# Patient Record
Sex: Male | Born: 1990 | Race: Black or African American | Hispanic: No | Marital: Single | Smoking: Current every day smoker
Health system: Southern US, Community
[De-identification: ages and names within clinical notes are randomized; demographics above are authoritative.]

## PROBLEM LIST (undated history)

## (undated) DIAGNOSIS — E119 Type 2 diabetes mellitus without complications: Secondary | ICD-10-CM

---
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
## 2011-04-21 ENCOUNTER — Emergency Department: Payer: Self-pay | Admitting: Internal Medicine

## 2014-02-27 ENCOUNTER — Emergency Department: Payer: Self-pay | Admitting: Emergency Medicine

## 2014-02-27 LAB — CBC WITH DIFFERENTIAL/PLATELET
BASOS ABS: 0.1 10*3/uL (ref 0.0–0.1)
Basophil %: 1.2 %
Eosinophil #: 0.6 10*3/uL (ref 0.0–0.7)
Eosinophil %: 6.1 %
HCT: 40.7 % (ref 40.0–52.0)
HGB: 14.3 g/dL (ref 13.0–18.0)
Lymphocyte #: 2.3 10*3/uL (ref 1.0–3.6)
Lymphocyte %: 23.1 %
MCH: 27.2 pg (ref 26.0–34.0)
MCHC: 35.2 g/dL (ref 32.0–36.0)
MCV: 77 fL — ABNORMAL LOW (ref 80–100)
Monocyte #: 0.8 x10 3/mm (ref 0.2–1.0)
Monocyte %: 7.8 %
Neutrophil #: 6.1 10*3/uL (ref 1.4–6.5)
Neutrophil %: 61.8 %
Platelet: 301 10*3/uL (ref 150–440)
RBC: 5.26 10*6/uL (ref 4.40–5.90)
RDW: 12.6 % (ref 11.5–14.5)
WBC: 9.9 10*3/uL (ref 3.8–10.6)

## 2014-02-27 LAB — COMPREHENSIVE METABOLIC PANEL
Albumin: 3.4 g/dL (ref 3.4–5.0)
Alkaline Phosphatase: 91 U/L
Anion Gap: 10 (ref 7–16)
BUN: 10 mg/dL (ref 7–18)
Bilirubin,Total: 0.5 mg/dL (ref 0.2–1.0)
CHLORIDE: 97 mmol/L — AB (ref 98–107)
Calcium, Total: 8.7 mg/dL (ref 8.5–10.1)
Co2: 23 mmol/L (ref 21–32)
Creatinine: 0.77 mg/dL (ref 0.60–1.30)
EGFR (African American): >60
EGFR (Non-African Amer.): >60
GLUCOSE: 495 mg/dL — AB (ref 65–99)
Osmolality: 282 (ref 275–301)
Potassium: 4.5 mmol/L (ref 3.5–5.1)
SGOT(AST): 21 U/L (ref 15–37)
SGPT (ALT): 30 U/L (ref 12–78)
Sodium: 130 mmol/L — ABNORMAL LOW (ref 136–145)
Total Protein: 8 g/dL (ref 6.4–8.2)

## 2014-02-27 LAB — URINALYSIS, COMPLETE
BLOOD: NEGATIVE
Bacteria: NONE SEEN
Bilirubin,UR: NEGATIVE
Glucose,UR: >=500 mg/dL (ref 0–75)
LEUKOCYTE ESTERASE: NEGATIVE
NITRITE: NEGATIVE
PROTEIN: NEGATIVE
Ph: 5 (ref 4.5–8.0)
RBC,UR: NONE SEEN /HPF (ref 0–5)
Specific Gravity: 1.036 (ref 1.003–1.030)
Squamous Epithelial: 1
WBC UR: 1 /HPF (ref 0–5)

## 2014-02-27 LAB — HEMOGLOBIN A1C: Hemoglobin A1C: 12.8 % — ABNORMAL HIGH (ref 4.2–6.3)

## 2014-04-27 ENCOUNTER — Inpatient Hospital Stay: Payer: Self-pay | Admitting: Internal Medicine

## 2014-04-27 LAB — COMPREHENSIVE METABOLIC PANEL
ALK PHOS: 97 U/L
ALT: 13 U/L (ref 12–78)
Albumin: 3.7 g/dL (ref 3.4–5.0)
Albumin: 2.8 g/dL — ABNORMAL LOW (ref 3.4–5.0)
Alkaline Phosphatase: 118 U/L — ABNORMAL HIGH
Anion Gap: 22 — ABNORMAL HIGH (ref 7–16)
BUN: 18 mg/dL (ref 7–18)
BUN: 18 mg/dL (ref 7–18)
Bilirubin,Total: 0.5 mg/dL (ref 0.2–1.0)
Bilirubin,Total: 0.4 mg/dL (ref 0.2–1.0)
CREATININE: 1.34 mg/dL — AB (ref 0.60–1.30)
Calcium, Total: 9.4 mg/dL (ref 8.5–10.1)
Calcium, Total: 7.7 mg/dL — ABNORMAL LOW (ref 8.5–10.1)
Chloride: 106 mmol/L (ref 98–107)
Chloride: 117 mmol/L — ABNORMAL HIGH (ref 98–107)
Co2: 7 mmol/L — CL (ref 21–32)
Creatinine: 1.4 mg/dL — ABNORMAL HIGH (ref 0.60–1.30)
EGFR (Non-African Amer.): >60
GLUCOSE: 578 mg/dL — AB (ref 65–99)
Glucose: 688 mg/dL (ref 65–99)
OSMOLALITY: 319 (ref 275–301)
Osmolality: 312 (ref 275–301)
POTASSIUM: 4 mmol/L (ref 3.5–5.1)
Potassium: 4.1 mmol/L (ref 3.5–5.1)
SGOT(AST): 13 U/L — ABNORMAL LOW (ref 15–37)
SGOT(AST): 17 U/L (ref 15–37)
SGPT (ALT): 15 U/L (ref 12–78)
SODIUM: 139 mmol/L (ref 136–145)
Sodium: 146 mmol/L — ABNORMAL HIGH (ref 136–145)
TOTAL PROTEIN: 8.9 g/dL — AB (ref 6.4–8.2)
TOTAL PROTEIN: 6.9 g/dL (ref 6.4–8.2)

## 2014-04-27 LAB — DRUG SCREEN, URINE

## 2014-04-27 LAB — BASIC METABOLIC PANEL
ANION GAP: 23 — AB (ref 7–16)
Anion Gap: 22 — ABNORMAL HIGH (ref 7–16)
Anion Gap: 14 (ref 7–16)
BUN: 24 mg/dL — ABNORMAL HIGH (ref 7–18)
BUN: 26 mg/dL — ABNORMAL HIGH (ref 7–18)
BUN: 26 mg/dL — AB (ref 7–18)
CALCIUM: 9.3 mg/dL (ref 8.5–10.1)
CHLORIDE: 114 mmol/L — AB (ref 98–107)
CHLORIDE: 125 mmol/L — AB (ref 98–107)
Calcium, Total: 9 mg/dL (ref 8.5–10.1)
Calcium, Total: 9.5 mg/dL (ref 8.5–10.1)
Chloride: 119 mmol/L — ABNORMAL HIGH (ref 98–107)
Co2: 8 mmol/L — CL (ref 21–32)
Co2: 10 mmol/L — CL (ref 21–32)
Co2: 14 mmol/L — ABNORMAL LOW (ref 21–32)
Creatinine: 1.64 mg/dL — ABNORMAL HIGH (ref 0.60–1.30)
Creatinine: 1.69 mg/dL — ABNORMAL HIGH (ref 0.60–1.30)
Creatinine: 2.04 mg/dL — ABNORMAL HIGH (ref 0.60–1.30)
EGFR (African American): >60
EGFR (African American): 51 — ABNORMAL LOW
EGFR (Non-African Amer.): 58 — ABNORMAL LOW
EGFR (Non-African Amer.): 44 — ABNORMAL LOW
GFR CALC NON AF AMER: 56 — AB
GLUCOSE: 613 mg/dL — AB (ref 65–99)
GLUCOSE: 420 mg/dL — AB (ref 65–99)
GLUCOSE: 259 mg/dL — AB (ref 65–99)
Osmolality: 321 (ref 275–301)
Osmolality: 322 (ref 275–301)
Osmolality: 317 (ref 275–301)
Potassium: 4.1 mmol/L (ref 3.5–5.1)
Potassium: 3 mmol/L — ABNORMAL LOW (ref 3.5–5.1)
Potassium: 3 mmol/L — ABNORMAL LOW (ref 3.5–5.1)
SODIUM: 145 mmol/L (ref 136–145)
Sodium: 151 mmol/L — ABNORMAL HIGH (ref 136–145)
Sodium: 153 mmol/L — ABNORMAL HIGH (ref 136–145)

## 2014-04-27 LAB — URINALYSIS, COMPLETE
Bacteria: NONE SEEN
Bilirubin,UR: NEGATIVE
LEUKOCYTE ESTERASE: NEGATIVE
Nitrite: NEGATIVE
PH: 5 (ref 4.5–8.0)
Protein: 100
RBC,UR: 3 /HPF (ref 0–5)
Specific Gravity: 1.029 (ref 1.003–1.030)
Squamous Epithelial: <1

## 2014-04-27 LAB — CBC
HCT: 49.8 % (ref 40.0–52.0)
HGB: 16.1 g/dL (ref 13.0–18.0)
MCH: 26.9 pg (ref 26.0–34.0)
MCHC: 32.3 g/dL (ref 32.0–36.0)
MCV: 83 fL (ref 80–100)
PLATELETS: 387 10*3/uL (ref 150–440)
RBC: 5.98 10*6/uL — ABNORMAL HIGH (ref 4.40–5.90)
RDW: 14.4 % (ref 11.5–14.5)
WBC: 32.1 10*3/uL — AB (ref 3.8–10.6)

## 2014-04-27 LAB — LIPASE, BLOOD: Lipase: 1773 U/L — ABNORMAL HIGH (ref 73–393)

## 2014-04-28 LAB — BASIC METABOLIC PANEL
Anion Gap: 11 (ref 7–16)
BUN: 26 mg/dL — ABNORMAL HIGH (ref 7–18)
BUN: 23 mg/dL — ABNORMAL HIGH (ref 7–18)
BUN: 21 mg/dL — ABNORMAL HIGH (ref 7–18)
CO2: 17 mmol/L — AB (ref 21–32)
CREATININE: 2.23 mg/dL — AB (ref 0.60–1.30)
CREATININE: 1.8 mg/dL — AB (ref 0.60–1.30)
Calcium, Total: 10 mg/dL (ref 8.5–10.1)
Calcium, Total: 9.8 mg/dL (ref 8.5–10.1)
Calcium, Total: 9.6 mg/dL (ref 8.5–10.1)
Chloride: 124 mmol/L — ABNORMAL HIGH (ref 98–107)
Chloride: >128 mmol/L — ABNORMAL HIGH (ref 98–107)
Chloride: >128 mmol/L — ABNORMAL HIGH (ref 98–107)
Co2: 16 mmol/L — ABNORMAL LOW (ref 21–32)
Co2: 16 mmol/L — ABNORMAL LOW (ref 21–32)
Creatinine: 1.9 mg/dL — ABNORMAL HIGH (ref 0.60–1.30)
EGFR (African American): 46 — ABNORMAL LOW
EGFR (African American): 60 — ABNORMAL LOW
EGFR (African American): 56 — ABNORMAL LOW
EGFR (Non-African Amer.): 52 — ABNORMAL LOW
GFR CALC NON AF AMER: 40 — AB
GFR CALC NON AF AMER: 48 — AB
GLUCOSE: 177 mg/dL — AB (ref 65–99)
Glucose: 156 mg/dL — ABNORMAL HIGH (ref 65–99)
Glucose: 184 mg/dL — ABNORMAL HIGH (ref 65–99)
OSMOLALITY: 315 (ref 275–301)
Osmolality: 308 (ref 275–301)
Osmolality: 315 (ref 275–301)
POTASSIUM: 3.6 mmol/L (ref 3.5–5.1)
Potassium: 3 mmol/L — ABNORMAL LOW (ref 3.5–5.1)
Potassium: 3.2 mmol/L — ABNORMAL LOW (ref 3.5–5.1)
SODIUM: 151 mmol/L — AB (ref 136–145)
Sodium: 155 mmol/L — ABNORMAL HIGH (ref 136–145)
Sodium: 155 mmol/L — ABNORMAL HIGH (ref 136–145)

## 2014-04-28 LAB — PHOSPHORUS
PHOSPHORUS: 0.2 mg/dL — AB (ref 2.5–4.9)
Phosphorus: 0.6 mg/dL — CL (ref 2.5–4.9)
Phosphorus: 0.8 mg/dL — CL (ref 2.5–4.9)

## 2014-04-28 LAB — CBC WITH DIFFERENTIAL/PLATELET
Basophil #: 0.1 10*3/uL (ref 0.0–0.1)
Basophil %: 0.3 %
EOS PCT: 0 %
Eosinophil #: 0 10*3/uL (ref 0.0–0.7)
HCT: 38.3 % — ABNORMAL LOW (ref 40.0–52.0)
HGB: 13 g/dL (ref 13.0–18.0)
LYMPHS ABS: 0.8 10*3/uL — AB (ref 1.0–3.6)
Lymphocyte %: 4.3 %
MCH: 27.1 pg (ref 26.0–34.0)
MCHC: 33.9 g/dL (ref 32.0–36.0)
MCV: 80 fL (ref 80–100)
Monocyte #: 2.7 x10 3/mm — ABNORMAL HIGH (ref 0.2–1.0)
Monocyte %: 14.1 %
Neutrophil #: 15.3 10*3/uL — ABNORMAL HIGH (ref 1.4–6.5)
Neutrophil %: 81.3 %
Platelet: 260 10*3/uL (ref 150–440)
RBC: 4.8 10*6/uL (ref 4.40–5.90)
RDW: 13.5 % (ref 11.5–14.5)
WBC: 18.9 10*3/uL — AB (ref 3.8–10.6)

## 2014-04-28 LAB — LIPID PANEL
Cholesterol: 237 mg/dL — ABNORMAL HIGH (ref 0–200)
HDL Cholesterol: 36 mg/dL — ABNORMAL LOW (ref 40–60)
Triglycerides: 524 mg/dL — ABNORMAL HIGH (ref 0–200)

## 2014-04-28 LAB — MAGNESIUM: Magnesium: 2.3 mg/dL

## 2014-04-28 LAB — LIPASE, BLOOD: LIPASE: 1091 U/L — AB (ref 73–393)

## 2014-04-29 LAB — CBC WITH DIFFERENTIAL/PLATELET
BASOS ABS: 0.1 10*3/uL (ref 0.0–0.1)
Basophil %: 0.6 %
Eosinophil #: 0.2 10*3/uL (ref 0.0–0.7)
Eosinophil %: 1.2 %
HCT: 35 % — ABNORMAL LOW (ref 40.0–52.0)
HGB: 12 g/dL — ABNORMAL LOW (ref 13.0–18.0)
LYMPHS ABS: 1.3 10*3/uL (ref 1.0–3.6)
Lymphocyte %: 8.5 %
MCH: 26.8 pg (ref 26.0–34.0)
MCHC: 34.4 g/dL (ref 32.0–36.0)
MCV: 78 fL — ABNORMAL LOW (ref 80–100)
MONO ABS: 2.3 x10 3/mm — AB (ref 0.2–1.0)
Monocyte %: 14.6 %
NEUTROS ABS: 11.8 10*3/uL — AB (ref 1.4–6.5)
Neutrophil %: 75.1 %
Platelet: 220 10*3/uL (ref 150–440)
RBC: 4.49 10*6/uL (ref 4.40–5.90)
RDW: 13.6 % (ref 11.5–14.5)
WBC: 15.7 10*3/uL — ABNORMAL HIGH (ref 3.8–10.6)

## 2014-04-29 LAB — BASIC METABOLIC PANEL
ANION GAP: 9 (ref 7–16)
Anion Gap: 6 — ABNORMAL LOW (ref 7–16)
BUN: 17 mg/dL (ref 7–18)
BUN: 15 mg/dL (ref 7–18)
BUN: 17 mg/dL (ref 7–18)
CALCIUM: 8.8 mg/dL (ref 8.5–10.1)
CALCIUM: 8.4 mg/dL — AB (ref 8.5–10.1)
CHLORIDE: 127 mmol/L — AB (ref 98–107)
CHLORIDE: 127 mmol/L — AB (ref 98–107)
CREATININE: 1.39 mg/dL — AB (ref 0.60–1.30)
Calcium, Total: 9.3 mg/dL (ref 8.5–10.1)
Chloride: >128 mmol/L — ABNORMAL HIGH (ref 98–107)
Co2: 16 mmol/L — ABNORMAL LOW (ref 21–32)
Co2: 18 mmol/L — ABNORMAL LOW (ref 21–32)
Co2: 21 mmol/L (ref 21–32)
Creatinine: 1.57 mg/dL — ABNORMAL HIGH (ref 0.60–1.30)
Creatinine: 1.49 mg/dL — ABNORMAL HIGH (ref 0.60–1.30)
EGFR (African American): >60
EGFR (Non-African Amer.): >60
GLUCOSE: 168 mg/dL — AB (ref 65–99)
Glucose: 199 mg/dL — ABNORMAL HIGH (ref 65–99)
Glucose: 211 mg/dL — ABNORMAL HIGH (ref 65–99)
OSMOLALITY: 313 (ref 275–301)
Osmolality: 314 (ref 275–301)
Osmolality: 311 (ref 275–301)
POTASSIUM: 2.9 mmol/L — AB (ref 3.5–5.1)
Potassium: 3.1 mmol/L — ABNORMAL LOW (ref 3.5–5.1)
Potassium: 2.9 mmol/L — ABNORMAL LOW (ref 3.5–5.1)
SODIUM: 154 mmol/L — AB (ref 136–145)
Sodium: 155 mmol/L — ABNORMAL HIGH (ref 136–145)
Sodium: 154 mmol/L — ABNORMAL HIGH (ref 136–145)

## 2014-04-29 LAB — PHOSPHORUS
PHOSPHORUS: 1.1 mg/dL — AB (ref 2.5–4.9)
PHOSPHORUS: 3.1 mg/dL (ref 2.5–4.9)
Phosphorus: 1.9 mg/dL — ABNORMAL LOW (ref 2.5–4.9)

## 2014-04-29 LAB — MAGNESIUM: MAGNESIUM: 1.8 mg/dL

## 2014-04-29 LAB — LIPASE, BLOOD: LIPASE: 114 U/L (ref 73–393)

## 2014-04-29 LAB — HEMOGLOBIN A1C

## 2014-04-30 LAB — URINALYSIS, COMPLETE
Bacteria: NONE SEEN
Bilirubin,UR: NEGATIVE
Glucose,UR: 50 mg/dL (ref 0–75)
Ketone: NEGATIVE
Leukocyte Esterase: NEGATIVE
Nitrite: NEGATIVE
Ph: 5 (ref 4.5–8.0)
Protein: 30
RBC,UR: 1 /HPF (ref 0–5)
Specific Gravity: 1.017 (ref 1.003–1.030)
WBC UR: 4 /HPF (ref 0–5)

## 2014-04-30 LAB — BASIC METABOLIC PANEL
ANION GAP: 4 — AB (ref 7–16)
Anion Gap: 7 (ref 7–16)
BUN: 17 mg/dL (ref 7–18)
BUN: 16 mg/dL (ref 7–18)
CALCIUM: 8.6 mg/dL (ref 8.5–10.1)
CHLORIDE: 127 mmol/L — AB (ref 98–107)
CO2: 24 mmol/L (ref 21–32)
Calcium, Total: 8.5 mg/dL (ref 8.5–10.1)
Chloride: 125 mmol/L — ABNORMAL HIGH (ref 98–107)
Co2: 21 mmol/L (ref 21–32)
Creatinine: 1.32 mg/dL — ABNORMAL HIGH (ref 0.60–1.30)
Creatinine: 1.24 mg/dL (ref 0.60–1.30)
EGFR (African American): >60
EGFR (African American): >60
EGFR (Non-African Amer.): >60
GLUCOSE: 164 mg/dL — AB (ref 65–99)
Glucose: 172 mg/dL — ABNORMAL HIGH (ref 65–99)
OSMOLALITY: 313 (ref 275–301)
OSMOLALITY: 308 (ref 275–301)
Potassium: 2.9 mmol/L — ABNORMAL LOW (ref 3.5–5.1)
Potassium: 3.6 mmol/L (ref 3.5–5.1)
SODIUM: 155 mmol/L — AB (ref 136–145)
Sodium: 153 mmol/L — ABNORMAL HIGH (ref 136–145)

## 2014-04-30 LAB — MAGNESIUM: Magnesium: 1.8 mg/dL

## 2014-04-30 LAB — PHOSPHORUS: Phosphorus: 2.3 mg/dL — ABNORMAL LOW (ref 2.5–4.9)

## 2014-05-01 LAB — CBC WITH DIFFERENTIAL/PLATELET
Basophil: 1 %
EOS PCT: 2 %
HCT: 35.5 % — ABNORMAL LOW (ref 40.0–52.0)
HGB: 12.3 g/dL — ABNORMAL LOW (ref 13.0–18.0)
Lymphocytes: 11 %
MCH: 26.5 pg (ref 26.0–34.0)
MCHC: 34.5 g/dL (ref 32.0–36.0)
MCV: 77 fL — AB (ref 80–100)
MONOS PCT: 12 %
PLATELETS: 246 10*3/uL (ref 150–440)
RBC: 4.62 10*6/uL (ref 4.40–5.90)
RDW: 13.5 % (ref 11.5–14.5)
Segmented Neutrophils: 74 %
WBC: 18.9 10*3/uL — ABNORMAL HIGH (ref 3.8–10.6)

## 2014-05-01 LAB — BASIC METABOLIC PANEL
ANION GAP: 3 — AB (ref 7–16)
BUN: 14 mg/dL (ref 7–18)
CALCIUM: 8.6 mg/dL (ref 8.5–10.1)
Chloride: 120 mmol/L — ABNORMAL HIGH (ref 98–107)
Co2: 26 mmol/L (ref 21–32)
Creatinine: 1.23 mg/dL (ref 0.60–1.30)
EGFR (Non-African Amer.): >60
GLUCOSE: 148 mg/dL — AB (ref 65–99)
Osmolality: 299 (ref 275–301)
POTASSIUM: 3.3 mmol/L — AB (ref 3.5–5.1)
SODIUM: 149 mmol/L — AB (ref 136–145)

## 2014-05-01 LAB — MAGNESIUM: Magnesium: 2.2 mg/dL

## 2014-05-01 LAB — HEPATIC FUNCTION PANEL A (ARMC)
ALBUMIN: 2 g/dL — AB (ref 3.4–5.0)
AST: 58 U/L — AB (ref 15–37)
Alkaline Phosphatase: 89 U/L
BILIRUBIN TOTAL: 0.5 mg/dL (ref 0.2–1.0)
Bilirubin, Direct: 0.1 mg/dL (ref 0.00–0.20)
SGPT (ALT): 53 U/L (ref 12–78)
Total Protein: 6.4 g/dL (ref 6.4–8.2)

## 2014-05-01 LAB — RAPID HIV-1/2 QL/CONFIRM: HIV-1/2, RAPID QL: NEGATIVE

## 2014-05-01 LAB — PHOSPHORUS: PHOSPHORUS: 3.1 mg/dL (ref 2.5–4.9)

## 2014-05-02 LAB — CBC WITH DIFFERENTIAL/PLATELET
BASOS ABS: 0.1 10*3/uL (ref 0.0–0.1)
BASOS PCT: 0.4 %
Eosinophil #: 0.5 10*3/uL (ref 0.0–0.7)
Eosinophil %: 2.8 %
HCT: 31.8 % — ABNORMAL LOW (ref 40.0–52.0)
HGB: 10.8 g/dL — ABNORMAL LOW (ref 13.0–18.0)
Lymphocyte #: 2.1 10*3/uL (ref 1.0–3.6)
Lymphocyte %: 12.9 %
MCH: 26.6 pg (ref 26.0–34.0)
MCHC: 33.9 g/dL (ref 32.0–36.0)
MCV: 78 fL — ABNORMAL LOW (ref 80–100)
Monocyte #: 2.4 x10 3/mm — ABNORMAL HIGH (ref 0.2–1.0)
Monocyte %: 14.6 %
Neutrophil #: 11.5 10*3/uL — ABNORMAL HIGH (ref 1.4–6.5)
Neutrophil %: 69.3 %
Platelet: 265 10*3/uL (ref 150–440)
RBC: 4.06 10*6/uL — AB (ref 4.40–5.90)
RDW: 13.6 % (ref 11.5–14.5)
WBC: 16.5 10*3/uL — AB (ref 3.8–10.6)

## 2014-05-02 LAB — BASIC METABOLIC PANEL
ANION GAP: 3 — AB (ref 7–16)
BUN: 16 mg/dL (ref 7–18)
Calcium, Total: 8.3 mg/dL — ABNORMAL LOW (ref 8.5–10.1)
Chloride: 118 mmol/L — ABNORMAL HIGH (ref 98–107)
Co2: 27 mmol/L (ref 21–32)
Creatinine: 1.39 mg/dL — ABNORMAL HIGH (ref 0.60–1.30)
GLUCOSE: 164 mg/dL — AB (ref 65–99)
Osmolality: 299 (ref 275–301)
POTASSIUM: 3.8 mmol/L (ref 3.5–5.1)
SODIUM: 148 mmol/L — AB (ref 136–145)

## 2014-05-02 LAB — VANCOMYCIN, TROUGH: Vancomycin, Trough: 16 ug/mL (ref 10–20)

## 2014-05-02 LAB — CULTURE, BLOOD (SINGLE)

## 2014-05-03 LAB — BASIC METABOLIC PANEL
Anion Gap: 4 — ABNORMAL LOW (ref 7–16)
BUN: 10 mg/dL (ref 7–18)
Calcium, Total: 8.3 mg/dL — ABNORMAL LOW (ref 8.5–10.1)
Chloride: 110 mmol/L — ABNORMAL HIGH (ref 98–107)
Co2: 32 mmol/L (ref 21–32)
Creatinine: 1.17 mg/dL (ref 0.60–1.30)
EGFR (African American): >60
GLUCOSE: 169 mg/dL — AB (ref 65–99)
OSMOLALITY: 294 (ref 275–301)
Potassium: 3.8 mmol/L (ref 3.5–5.1)
SODIUM: 146 mmol/L — AB (ref 136–145)

## 2014-05-03 LAB — EXPECTORATED SPUTUM ASSESSMENT W REFEX TO RESP CULTURE

## 2014-05-03 LAB — CBC WITH DIFFERENTIAL/PLATELET
BASOS ABS: 0.1 10*3/uL (ref 0.0–0.1)
Basophil %: 0.4 %
Eosinophil #: 0.5 10*3/uL (ref 0.0–0.7)
Eosinophil %: 3.9 %
HCT: 31.3 % — AB (ref 40.0–52.0)
HGB: 10.5 g/dL — AB (ref 13.0–18.0)
LYMPHS ABS: 1.8 10*3/uL (ref 1.0–3.6)
Lymphocyte %: 13.1 %
MCH: 26.4 pg (ref 26.0–34.0)
MCHC: 33.5 g/dL (ref 32.0–36.0)
MCV: 79 fL — AB (ref 80–100)
MONOS PCT: 18.6 %
Monocyte #: 2.6 x10 3/mm — ABNORMAL HIGH (ref 0.2–1.0)
NEUTROS PCT: 64 %
Neutrophil #: 8.8 10*3/uL — ABNORMAL HIGH (ref 1.4–6.5)
Platelet: 325 10*3/uL (ref 150–440)
RBC: 3.97 10*6/uL — ABNORMAL LOW (ref 4.40–5.90)
RDW: 13.8 % (ref 11.5–14.5)
WBC: 13.7 10*3/uL — AB (ref 3.8–10.6)

## 2014-05-03 LAB — PHOSPHORUS: Phosphorus: 5.2 mg/dL — ABNORMAL HIGH (ref 2.5–4.9)

## 2014-05-03 LAB — MONONUCLEOSIS SCREEN: Mono Test: NEGATIVE

## 2014-05-03 LAB — MAGNESIUM: Magnesium: 2 mg/dL

## 2014-05-04 LAB — COMPREHENSIVE METABOLIC PANEL
ALK PHOS: 79 U/L
AST: 26 U/L (ref 15–37)
Albumin: 1.7 g/dL — ABNORMAL LOW (ref 3.4–5.0)
Anion Gap: 2 — ABNORMAL LOW (ref 7–16)
BUN: 12 mg/dL (ref 7–18)
Bilirubin,Total: 0.2 mg/dL (ref 0.2–1.0)
CHLORIDE: 110 mmol/L — AB (ref 98–107)
CREATININE: 1.14 mg/dL (ref 0.60–1.30)
Calcium, Total: 8.6 mg/dL (ref 8.5–10.1)
Co2: 31 mmol/L (ref 21–32)
EGFR (African American): >60
GLUCOSE: 169 mg/dL — AB (ref 65–99)
Osmolality: 289 (ref 275–301)
POTASSIUM: 4.6 mmol/L (ref 3.5–5.1)
SGPT (ALT): 33 U/L (ref 12–78)
SODIUM: 143 mmol/L (ref 136–145)
TOTAL PROTEIN: 6.1 g/dL — AB (ref 6.4–8.2)

## 2014-05-04 LAB — CULTURE, BLOOD (SINGLE)

## 2014-05-05 ENCOUNTER — Ambulatory Visit (HOSPITAL_COMMUNITY)
Admission: AD | Admit: 2014-05-05 | Discharge: 2014-05-05 | Disposition: A | Discharge status: Home / Self Care | Payer: Self-pay | Source: Ambulatory Visit | Attending: Internal Medicine | Admitting: Internal Medicine

## 2014-05-05 DIAGNOSIS — E1010 Type 1 diabetes mellitus with ketoacidosis without coma: Secondary | ICD-10-CM | POA: Insufficient documentation

## 2014-05-05 LAB — BASIC METABOLIC PANEL
ANION GAP: 4 — AB (ref 7–16)
BUN: 15 mg/dL (ref 7–18)
CALCIUM: 8.3 mg/dL — AB (ref 8.5–10.1)
Chloride: 112 mmol/L — ABNORMAL HIGH (ref 98–107)
Co2: 33 mmol/L — ABNORMAL HIGH (ref 21–32)
Creatinine: 1.21 mg/dL (ref 0.60–1.30)
EGFR (African American): >60
EGFR (Non-African Amer.): >60
GLUCOSE: 175 mg/dL — AB (ref 65–99)
Osmolality: 301 (ref 275–301)
Potassium: 4.1 mmol/L (ref 3.5–5.1)
Sodium: 149 mmol/L — ABNORMAL HIGH (ref 136–145)

## 2014-05-05 LAB — PHOSPHORUS: Phosphorus: 3.7 mg/dL (ref 2.5–4.9)

## 2014-05-05 LAB — MAGNESIUM: MAGNESIUM: 2.3 mg/dL

## 2015-03-27 NOTE — Consult Note (Signed)
Chief Complaint and History:  Referring Physician Dr. Renee RamusGuitierrez   Chief Complaint DKA   Allergies:  Shellfish: Swelling, Hives  Assessment/Plan:  Assessment/Plan 24 yo M seen in consultation for DKA. Pt seen, examined, family was interviewed and chart was reviewed. He was admitted on 5/25 with severe DKA, renal failure, developed resp failure and required intubation. He remains on the vent. Slow to improve. Has been on IV insulin and now also on D5 with sodium bicarb in addition to tube feeds. IV insulin rate at 15 units/hr, up from 5 units at lowest yesterday. Sugars are now in normal range.  A/ DKA, resolved Diabetes - type one versus type 2 with A1c >16%, severely uncontrolled Acute renal failure, improving Hypernatremia and hyperchloridemia, improving Hypokalemia  P/ Continue with IV insulin until patient is off tube feeds and extubated. At that time, we will stop the IV dextrose and start SQ insulin and then stop the IV insulin. I can assist with this conversion, when needed. I would estimate his daily insulin requirement when fasting to be no more than 5 units/hr or 125 units/day. I suspect he will need bid basal insulin with Levemir in addition to a mealtime NovoLog and NovoLog SSI.  I will follow with you.  Full consult will be dictated.   Electronic Signatures: Raj JanusSolum, Anna M (MD)  (Signed 27-May-15 16:34)  Authored: Chief Complaint and History, ALLERGIES, Assessment/Plan   Last Updated: 27-May-15 16:34 by Raj JanusSolum, Anna M (MD)

## 2015-03-27 NOTE — Consult Note (Signed)
PATIENT NAME:  Jackson Anthony, Jackson Anthony MR#:  [id number] DATE OF BIRTH:  [birth year]  DATE OF CONSULTATION:  04/29/2014  REFERRING PHYSICIAN:  Felipa Furnace, MD CONSULTING PHYSICIAN:  A. Wendall Mola, MD  CHIEF COMPLAINT: DKA.   HISTORY OF PRESENT ILLNESS: This is a 24 year old male admitted on May 25 with diabetic ketoacidosis. Family provided the history; patient currently intubated. They explain he had been diagnosed with diabetes about 1 month prior to admission. He initially had been found by his mother to have blood sugars higher than an undetectable limit on her own glucometer. She had brought him to a primary care provider where he was started on metformin. Within 1 week, due to ongoing high blood sugars, once daily Levemir 20 units was added to the metformin. He did not have a glucometer and was not checking his own blood sugars. Three days prior to admission, he had developed a sore throat. He had gone to the [facility] local urgent care. He was given a steroid injection and then prescribed a steroid taper, what sounds like 60 mg on the first day, 50 mg on subsequent day, etc., in addition to a prescription for amoxicillin. The family believes he took the prednisone 2 days prior to admission and 1 day prior to admission; however, they noted he was acting of bizarrely, seemed angry and confused. He was brought to the ER and found to have an initial blood sugar of 688, elevated lipase of 1773, elevated creatinine of 1.34, elevated white count of 32.1, greater than 500 urine glucose, and an arterial pH of 7.02, along with 2+ urinary ketones, consistent with severe diabetic ketoacidosis. He has not had signs or symptoms of infection. He required intubation for respiratory failure. He has been monitored closely in the intensive care unit due to his DKA. He has been treated with IV fluids including sodium bicarb, IV insulin and has had aggressive electrolyte replacement. Blood sugars have gradually  improved and are now in the normal range. His renal function initially worsened, however, is now improving. Creatinine is back to 1.3. He is hypernatremic with a sodium of 154 and hypokalemic with a potassium of 2.9, as well as hypochloremic with chloride of 127. Acidosis has resolved. Yesterday morning, the patient was started on tube feeds, which he seems to be tolerating. He is currently requiring 15 units/hour of IV insulin to maintain blood sugars in the 120 to 200 range.   PAST MEDICAL HISTORY:   1.  Diabetes mellitus, diagnosis 1 month ago.   PAST SURGICAL HISTORY: None.   ALLERGIES: No known drug allergies.   CURRENT MEDICATIONS: 1.  IV regular insulin.  2.  Azithromycin 500 mg q.24 hours.  3.  Docusate 100 mg b.i.d.  4.  Pepcid 20 mg q.12 hours.  5.  Meropenem 1 gram q.8 hours.  6.  Metoprolol 25 mg q.12 hours.  7.  Potassium 40 mEq t.i.d.  8.  Heparin 5000 units q.8 hours.  9.  D5 plus 8.4% sodium bicarbonate at 100 mL/hour.  10.  Fentanyl 2.5 mg/250 mL drip at 10 mL/hour.  11.  Versed IV.   SOCIAL HISTORY:  The patient is single. He was with an aunt. He smokes about 6 cigarettes daily. Denies alcohol use. No illicit drug use.   FAMILY HISTORY:  Multiple family members with diabetes including his mother who requires insulin. No known family members diagnosed with diabetes under age 52.   REVIEW OF SYSTEMS:  Unable to obtain.   PHYSICAL EXAMINATION: VITAL  SIGNS:  Height 75 inches, weight 240 pounds, BMI 30.1. Temp 102, pulse 108, blood pressure 166/82, pulse ox 100%.  GENERAL:  The patient is intubated and sedated.  HEENT:  OG/OT in place, mucous membranes moist.  CARDIAC:  Tachycardic. No appreciable murmur.  Right neck central line in place. PULMONARY:  Clear bilaterally anteriorly.  ABDOMEN:  Diffusely soft, nondistended.  EXTREMITIES:  No peripheral edema is present.  SKIN:  No rash, dermatopathy or other acute skin changes are present.  NEUROLOGIC:  Unable to  assess.  PSYCHIATRIC:  Unable to assess.   LABORATORY DATA:  Glucose 211, BUN 15, creatinine 1.39, sodium 154, potassium 2.9, chloride 127, magnesium 1.9, hemoglobin A1c greater than 16%.   ASSESSMENT:  A 24 year old male initially with severe diabetic ketoacidosis, complicated by respiratory failure, possible pancreatitis, acute renal failure. Clinically has been slow to improve.   RECOMMENDATIONS: 1.  Continue IV insulin as you are until the patient is off tube feeds, extubated. I can help with transition to subcutaneous insulin. In reviewing his IV insulin needs, his minimum needs have been about 5 units per hour over the last 24 hours and that was prior to being started on the D5 and tube feeds. This would equate to about 125 units of insulin as a basal requirement per day. He may need less than this as his underlying inflammation and acute illness improved. In addition to basal insulin, we will need to give mealtime insulin and sliding scale. Again, we will wait until the patient is extubated and clinically improved.  2.  Continue aggressive electrolyte repletion. With high-dose insulin, he is at ongoing risk for continued hypokalemia.   I will follow along with you.   ____________________________ A. Wendall MolaMelissa Merwyn Hodapp, MD ams:dmm D: 04/29/2014 16:44:10 ET T: 04/29/2014 19:03:54 ET JOB#: [id number]  cc: A. Wendall MolaMelissa Lynde Ludwig, MD, <Dictator> Macy MisA. MELISSA Endora Teresi MD ELECTRONICALLY SIGNED 05/04/2014 14:13

## 2015-03-27 NOTE — Discharge Summary (Signed)
PATIENT NAME:  Jackson Anthony, Jackson Anthony MR#:  [id number] DATE OF BIRTH:  March 28, 1991  DATE OF ADMISSION:  04/27/2014 TENTATIVE DATE OF DISCHARGE:  05/05/2014  ADMISSION DIAGNOSES: 1.  Diabetic ketoacidosis. 2.  Encephalopathy secondary to diabetic ketoacidosis.  3.  Acute respiratory failure requiring intubation secondary to inability to protect airway.   DISCHARGE DIAGNOSES: 1.  Diabetic ketoacidosis.  2.  Anion gap/metabolic acidosis. 3.  Systemic inflammatory response syndrome. 4.  Acute respiratory failure. 5.  Altered mental status with metabolic encephalopathy.  6.  Acute kidney injury.  7.  Angioedema.  8.  Weeping vesicles on the back of his neck.  9.  Acute pancreatitis.  10.  Hyperlipidemia.  11.  Colonic ileus.   CONSULTATIONS:  1.  Dr. Meredeth Ide from pulmonary.  2.  Dr. Sampson Goon from infectious disease.  3.  Dr. Tedd Sias endocrinology.  4.  Dr. Chestine Spore from ENT.   LABORATORIES: At discharge sodium 149, potassium 4.1, chloride 112, bicarbonate 33, BUN 15, creatinine 1.21, glucose 175.   Chest x-ray shows a left internal jugular central venous catheter, increased opacity in the lung bases as a combination of effusions and atelectasis.   HOSPITAL COURSE:  55.  A 24 year old male who was admitted on the 25th with diabetic ketoacidosis. He was found to be a newly diagnosed diabetic about 2 to 3 months prior to admission. His diabetic ketoacidosis was triggered by the use of steroids that he received as an outpatient urgent care patient. He was also noted to have pancreatitis on admission. For further details, please refer to the H and P.  2.  Diabetic ketoacidosis. The patient presented with severe DKA with significant metabolic acidosis and encephalopathy. He was sent to the Critical Care Unit on DKA protocol. Dr. Tedd Sias from endocrinology had been consulted. The patient is currently on an insulin drip as he is still intubated.  3.  His hemoglobin A1c was 16.  4.  Anion gap/metabolic  acidosis due to DKA, which is resolving.  5.  Systemic inflammatory response syndrome. The patient had been spiking fevers. His sputum culture was positive for Candida and methicillin-sensitive Staphylococcus aureus.  CT of the chest showed a right upper lobe/right lower lobe patchiness with nodules. He will need follow-up CT scan in 3 to 4 months for the patchiness and nodules, especially given his smoking history. ID was consulted for his fevers. He was initially placed on broad-spectrum antibiotics including Flagyl, Levaquin and vancomycin. Likely the patient has aspiration pneumonia or vent associated pneumonia. His cultures did come back as MSSA so he is currently on Flagyl and Levaquin.  The Flagyl is basically to cover some aspiration as well. No need to treat for Candida as per ID consultation as this is from a trach aspiration.  6.  Acute respiratory failure. The patient was intubated on admission and then extubated by the help of ENT on 06/01; however, due to prolonged sedation and inability to protect airway. He is reintubated and 05/04/2014.   7.  Altered mental status. The patient has metabolic encephalopathy due to his acidosis which presented on admission.  8.  Acute kidney injury due to inflammatory process which is resolved.  9.  Hypernatremia from dehydration and volume depletion. The patient is on D5.  10.  Angioedema.  Unclear culprit.  There is a theory that it is possibly from tube feeds. Those have been stopped.  Pharmacy also alerted me that Protonix can cause angioedema. In any case, his angioedema is slowly resolving as he is on  IV Benadryl and IV steroids.  11.  Acute pancreatitis on admission, which resolved.  12.  Weeping vesicles on the back of his neck are negative for any viral cultures and these are getting better.  13.  Hyperlipidemia. The patient needs better diabetes control. This can be initiated once he is extubated.  14.  Hypertension, likely chronic worsened by  stress. The patient is on metoprolol.  15.  Colonic ileus, which has resolved. The patient is on stool softeners.   CURRENT MEDICATIONS:  1.  The patient is on a fentanyl drip. 2.  Versed drip. 3.  Insulin drip. 4.  TPN.  5.  Heparin 5000 units q.8 hours.  6.  Ibuprofen 200 mg q.4 hours p.r.n.  7.  Metoprolol 50 mg q.12 hours. 8.  Levaquin 750 mg IV q.24 hours.  9.  Flagyl 500 mg p.o. q.8 hours.  10.  Nystatin q.8 hours.  This is a cream.  11.  Methylprednisolone 60 mg IV q.8 hours.  12.  Bisacodyl 10 mg rectal suppository.   DISCHARGE DIET: Currently is TPN.   [facility]    ____________________________ Nicola Quesnell P. Juliene PinaMody, MD spm:dp D: 05/05/2014 13:05:42 ET T: 05/05/2014 13:20:13 ET JOB#: [id number]  cc: Zita Ozimek P. Juliene PinaMody, MD, <Dictator> Janyth ContesSITAL P Javoni Lucken MD ELECTRONICALLY SIGNED 05/06/2014 11:21

## 2015-03-27 NOTE — Consult Note (Signed)
PATIENT NAME:  Jackson Anthony, Jackson Anthony DATE OF BIRTH:  Oct 12, 1991  DATE OF CONSULTATION:  05/02/2014  REFERRING PHYSICIAN:  Dr. Ned ClinesHerbon Fleming CONSULTING PHYSICIAN:  Zackery BarefootJ. Madison Harace Mccluney, MD  REASON FOR CONSULTATION:  Tongue and lip swelling,    HISTORY OF PRESENT ILLNESS:  The patient is a 24 year old African American male who was admitted on the 25th with newly diagnosed diabetes in diabetic ketoacidosis and pancreatitis.  He was intubated due to altered mental status and for airway protection.  Broad-spectrum antibiotics were initiated.  He developed severe tongue swelling as well as lip swelling, according to his significant other [name]  The tongue and lip swelling has abated significantly over the past 24 to 36 hours.   PAST MEDICAL HISTORY:  Newly diagnosed diabetes.   PAST SURGICAL HISTORY:  None.   PHYSICAL EXAMINATION:   GENERAL:  Intubated orally, sedated, although responsive.  NECK:  There is a right-sided central intravenous catheter.  There are no palpable masses or lesions in the soft tissues of the neck.  Trachea is midline.  ORAL CAVITY AND OROPHARYNX:  The tongue extends approximately 1 cm beyond the incisors, significant dental decay and gingivitis, but no palpable abscess in the gingival labial sulcus.  NARES:  There is a nasogastric tube through the right nostril.  Septum is midline.   IMPRESSION:  Transient angioedema, possibly medication related.  In any case, the patient is too ill for extubation at this point.  As long as the tongue swelling continues to abate, when his general medical condition allows for extubation, I will be glad to flexibly scope him after extubation and evaluate his airway further.  At this point, there is no significant additional ear, nose, throat recommendation.    ____________________________ Shela CommonsJ. Gertie BaronMadison Vega Withrow, MD jmc:ea D: 05/02/2014 22:46:24 ET T: 05/03/2014 03:52:01 ET JOB#: [id number]  cc: Zackery BarefootJ. Madison Betta Balla, MD, <Dictator> [facility]  [facility] - Practice Administrator Wendee CoppJMADISON Jayvon Mounger MD ELECTRONICALLY SIGNED 05/07/2014 7:25

## 2015-03-27 NOTE — H&P (Signed)
PATIENT NAME:  Jackson Anthony, Jackson Anthony MR#:  [id number] DATE OF BIRTH:  1991/01/27  DATE OF ADMISSION:  04/27/2014  PRIMARY CARE PHYSICIAN: Unknown.  CHIEF COMPLAINT: Brought in with unresponsiveness.   HISTORY OF PRESENT ILLNESS: This is a 24 year old man, who was recently diagnosed with diabetes on metformin and Lantus. On Friday, he went to an urgent care and was given an antibiotic and a steroid shot and a steroid taper for an infection in the throat. He has not taken his insulin injection in 3 days. He has been vomiting, abdominal pain and altered mental status. In the ER, he was found to be in severe diabetic ketoacidosis with a pH of 7.02. He was altered mental status and unresponsive to sternal rub when I first saw him then. Then he did wake up a little bit, but was unable to communicate and was thrashing around in the bed. I discussed the case with the ER physician to intubate the patient secondary to severe acidosis, altered mental status, failure to protect his airway with the vomiting that he has been having and the patient also had a severe pancreatitis.   PAST MEDICAL HISTORY: Diabetes.   PAST SURGICAL HISTORY: None.   ALLERGIES: No known drug allergies.   MEDICATIONS: Include Glucophage 500 mg twice a day and Levemir 20 units once a day at bedtime.   SOCIAL HISTORY: As per the mother, uses E-cigarettes. No alcohol, but as per other family members may have used alcohol. No drug use. He works at [employer].   FAMILY HISTORY: Father is healthy. Mother with diabetes and hypertension. Uncle with diabetes.  REVIEW OF SYSTEMS: Unable to obtain secondary to altered mental status.   PHYSICAL EXAMINATION:  VITAL SIGNS: Temperature 98.2, pulse 128, respirations 32, blood pressure 123/66, pulse oximetry 98% on room air.  GENERAL: Slight respiratory distress, using accessory muscles.  EYES: Conjunctivae and lids are normal. Pupils equal, round, and reactive to light. Unable to test extraocular  muscles. Tympanic membrane on the left ear, no erythema. Unable to look into the throat wide, unable to get him to open up his mouth, but I did see brownish material on his teeth.  CARDIOVASCULAR: S1 and S2, tachycardic. No gallops, rubs, or murmurs heard. Carotid upstroke 2+ bilaterally. No bruits. Dorsalis pedis pulses 2+ bilaterally.  LUNGS: Clear to auscultation. No use of accessory muscles to breathe. No rhonchi, rales, or wheeze heard.  ABDOMEN: Soft. Slight tenderness in the epigastric area. No organomegaly/splenomegaly. Normoactive bowel sounds. No masses felt.  LYMPHATIC: No lymph nodes in the neck.  MUSCULOSKELETAL: No clubbing, edema or cyanosis.  SKIN: No ulcers or lesions.  NEUROLOGIC: Difficult to test with altered mental status, but he is moving extremities when he did wake up with a sternal rub. Initially, he was unresponsive to the sternal rub.  PSYCHIATRIC: Initially unresponsive to sternal rub, but moving all extremities now.   LABORATORY AND RADIOLOGICAL DATA: Chest x-ray: No active disease. ABG shows a pH of 7.02, pCO2 20, pO2 137, oxygen saturation 99.7. Glucose 688, BUN 18, creatinine 1.34, sodium 139, potassium 4.1, chloride 106, CO2 less than 5, calcium 9.4. Liver function tests: Alkaline phosphatase 118, ALT 15, AST 13, total protein 8.9, albumin 3.7. White blood count 32.1, hemoglobin and hematocrit 16.1 and 49.8, platelet count of 387. Lipase 1773, blood 2+ blood. EKG: Sinus tachycardia at 143 beats per minute, left atrial enlargement.   ASSESSMENT AND PLAN:  1.  Diabetic ketoacidosis, severe with acute encephalopathy. I pushed IV insulin 15 Aspart  x 1 and insulin drip started.  2.  Severe acidosis with acute encephalopathy. The patient is thrashing around. For the safety of the patient and those caring for him, I think the best option would be to intubate him and sedate him until his pH is better. I will give a sodium bicarbonate push. I will get pulmonary consultation. ER  physician will proceed with the intubation. I will sedate him with Versed and Midazolam.  3.  Acute pancreatitis. Will give meropenem with the increased white count for right now and recent infection and sore throat on Friday. Will give vigorous IV fluid hydration and check a lipase in the morning.  4.  Leukocytosis could be with the nausea and vomiting and DKA and/or the pancreatitis. Will empirically give meropenem at this point.  5.  Vomiting brownish material seen on the throat, possible aspiration. We will give IV Protonix stat and b.i.d.   TIME SPENT ON ADMISSION: 60 minutes critical care time.   ____________________________ Herschell Dimesichard J. Renae GlossWieting, MD rjw:aw D: 04/27/2014 12:12:22 ET T: 04/27/2014 12:22:28 ET JOB#: [id number]  cc: Herschell Dimesichard J. Renae GlossWieting, MD, <Dictator> Salley ScarletICHARD J Suhaan Perleberg MD ELECTRONICALLY SIGNED 04/30/2014 14:25

## 2015-03-27 NOTE — Consult Note (Signed)
PATIENT NAME:  Jackson Anthony, DORSEY MR#:  [id number] DATE OF BIRTH:  18-Feb-1991  DATE OF CONSULTATION:  05/01/2014  REFERRING PHYSICIAN:  Sital P. Juliene Pina, MD CONSULTING PHYSICIAN:  Stann Mainland. Sampson Goon, MD  REASON FOR CONSULTATION: Sepsis, DKA, pancreatitis, pneumonia and skin rash.   HISTORY OF PRESENT ILLNESS: This is a 24 year old unfortunate gentleman who was admitted May 25th with DKA and pancreatitis. He was quite ill at the time. He had only recently been diagnosed with diabetes. Since that time, he has had to be intubated due to altered mental status. He has been covered with broad-spectrum antibiotics but continues to have fevers to 102 as well as an elevated white blood count to 18. He has also developed what appears to be angioedema of his lips and tongue. He has also developed a vesicular eruption on the back of his neck.   PAST MEDICAL HISTORY: Diabetes newly diagnosed.   SURGICAL HISTORY: None.   ALLERGIES: No known drug allergies.   SOCIAL HISTORY: Per his family who is at the bedside, he used to smoke cigarettes, E cigarettes, but no other drug use. He worked at [employer]. He has a girlfriend.   FAMILY HISTORY: His father is healthy. Mother has diabetes, hypertension.   REVIEW OF SYSTEMS: Unable to be obtained.   ANTIBIOTICS SINCE ADMISSION: Include azithromycin given the 27th and the 26th, levofloxacin started May 27th, meropenem continued May 24th through the 28th and vancomycin begun the 27th.   PHYSICAL EXAMINATION:  VITAL SIGNS: Temperature 102, pulse 120, blood pressure 158/84, respirations 25, sat 98% on ventilator.  GENERAL: He is intubated, sedated.  HEENT: His lips and tongue are quite swollen and has angioedema. His pupils are reactive. Sclerae are anicteric.  NECK: Supple. On the back of his neck, he has some vesicular eruptions. He has a neck line on the right which is clean, dry and intact.  HEART: Tachy.  LUNGS: Coarse breath sounds bilaterally.  ABDOMEN: Soft,  mildly distended, nontender as far as we can tell.  EXTREMITIES: Edema 1+ bilaterally. No joint swelling or warmth.  NEUROLOGIC: He is sedated.   DATA: CT of the chest and abdomen done on the 28th shows bilateral pleural effusions and atelectasis and patchy infiltrate right lower lobe, nodular in nature. There was an irregular area of impaired nephrogram at the right kidney, and it is impossible to exclude pyelonephritis. No other acute intra-abdominal pathology. White blood count on admission was 32.1. It has come down to 18.9. Hemoglobin 12.3, platelets 246,000. LFTs done the 28th show only a low albumin and AST slightly elevated at 58. His lipase on admission was 1773, repeat on the 27th was only 114. Renal function shows a creatinine of 1.3. On admission, it was 1.34 and peaked at 2.0. Blood cultures x 2 on May 25th are negative and on the 27th from the line is negative. Sputum culture growing heavy yeast.   IMPRESSION: Quite ill 24 year old gentleman admitted with diabetic ketoacidosis and possible pancreatitis who is now intubated, has what appears to be angioedema, recurrent fevers and pneumonia. On admission, he was quite altered and thrashing about. His CT scan does show pneumonia which could potentially be the cause of his fevers and could potentially be aspiration in nature. He also has angioedema of unknown etiology.   RECOMMENDATIONS:  1. Check an HIV test.  2. I sent a C1Q esterase test for potential hereditary angioedema.  3. I have discontinued his meropenem given the angioedema. At this point, I would continue him  on the levofloxacin and vancomycin and have added Flagyl for anaerobic coverage.  4. For the vesicular lesions on the back of his neck, this is likely a viral reactivation. I have asked the nurse to swab for HSV and VZV. I would hold off on any antiviral treatment at this point, however.  5. Also for the angioedema, dietary and nursing are working on adjusting his tube feeds  as there may be some allergic component to that. I have also started him on Benadryl IV.   Thank you very much for the consult. I will be glad to follow with you.   ____________________________ Stann Mainlandavid P. Sampson GoonFitzgerald, MD dpf:gb D: 05/01/2014 14:36:06 ET T: 05/01/2014 15:02:26 ET JOB#: [id number]  cc: Stann Mainlandavid P. Sampson GoonFitzgerald, MD, <Dictator> DAVID Sampson GoonFITZGERALD MD ELECTRONICALLY SIGNED 05/02/2014 18:24

## 2015-08-04 IMAGING — CR DG ABDOMEN 1V
1 series · 3 of 3 positions shown · non-contrast
Comparison: CTA chest, abdomen and pelvis -04/30/2014

CLINICAL DATA: Abdominal distension, and diabetic ketoacidosis

EXAM:
ABDOMEN - 1 VIEW

[Series 1: supine kub · 0.17mm/px · 3 of 3 slices shown]
[im 1/3]
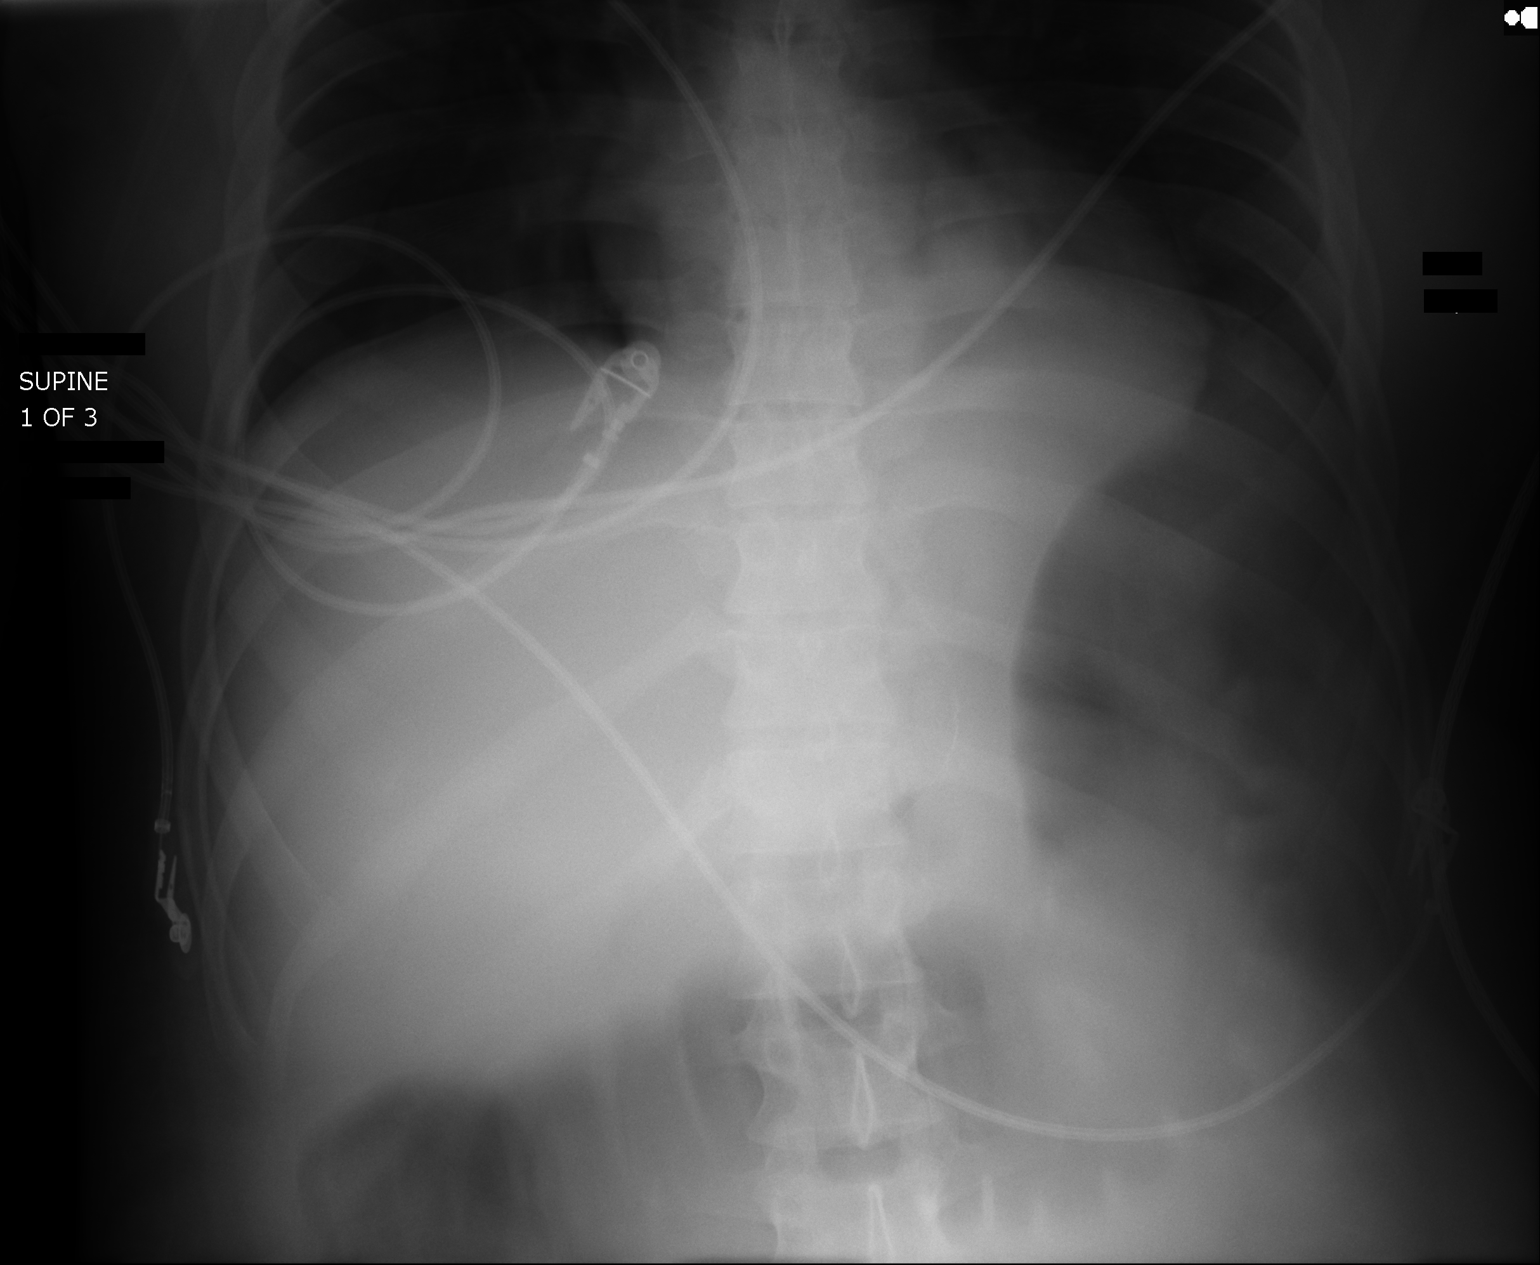
[im 2/3]
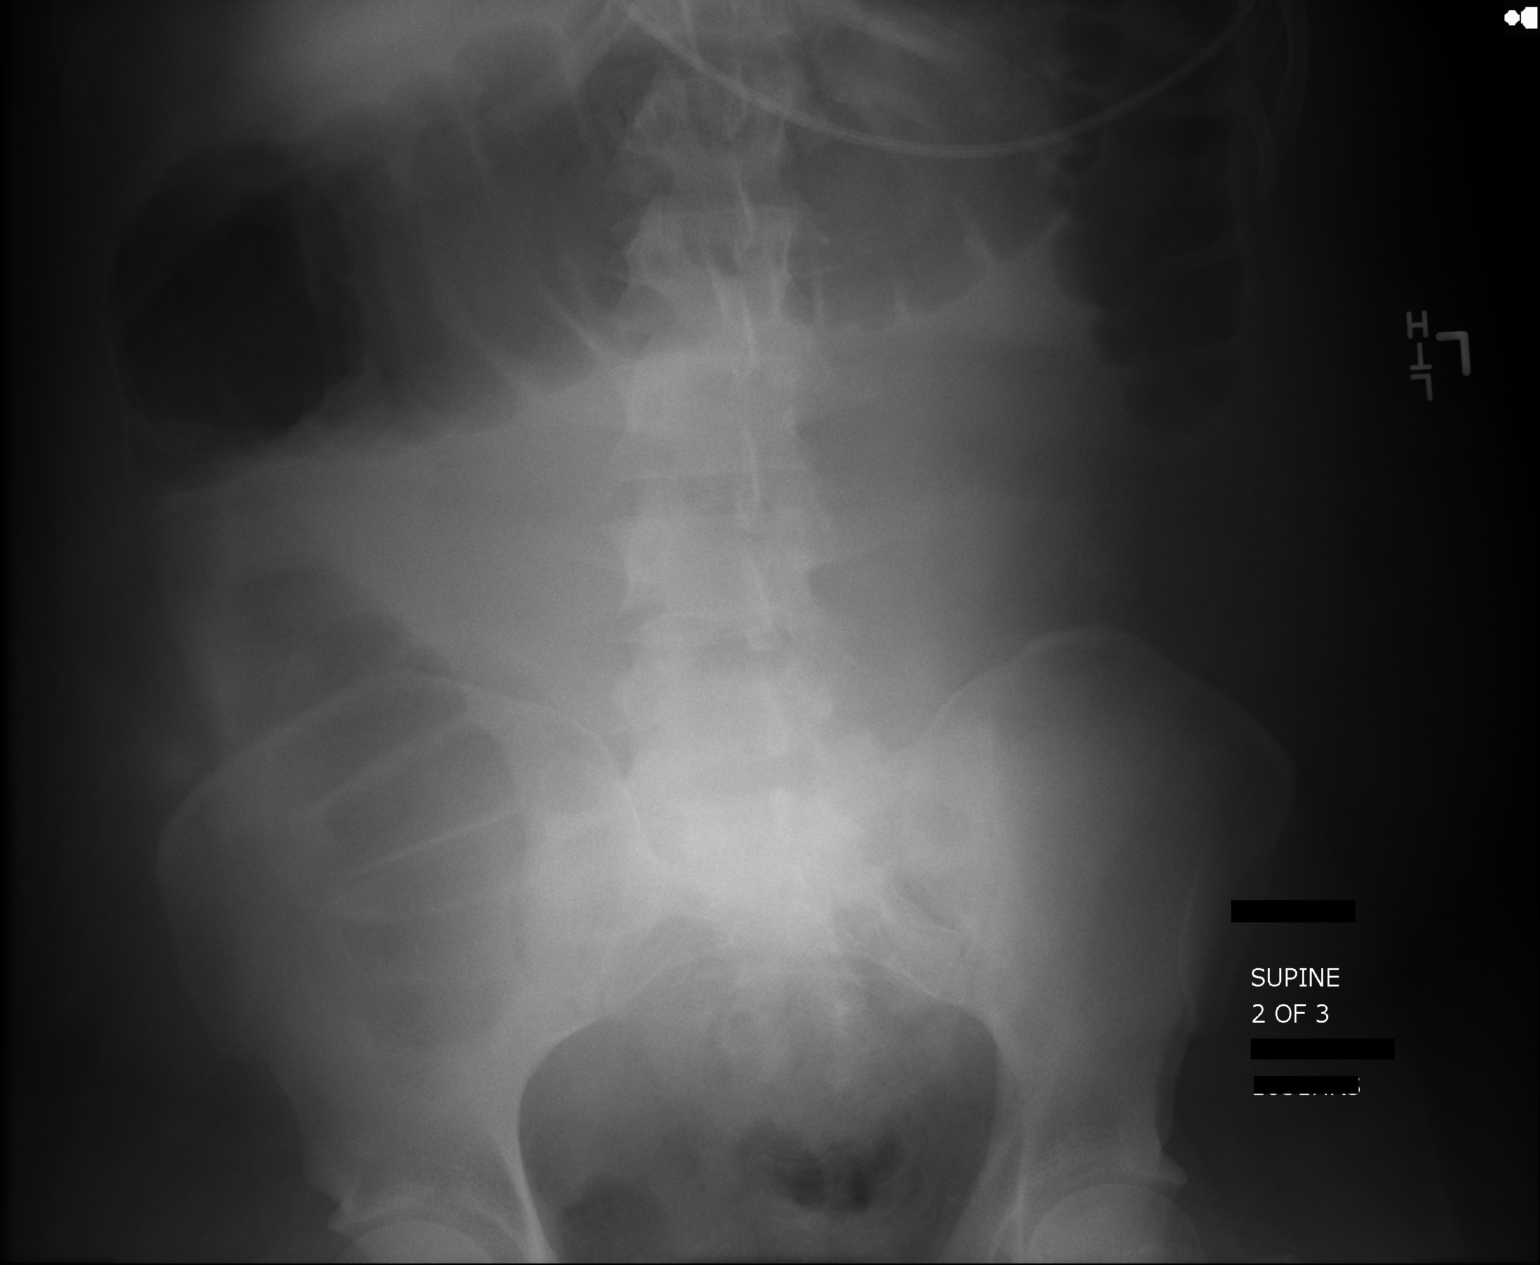
[im 3/3]
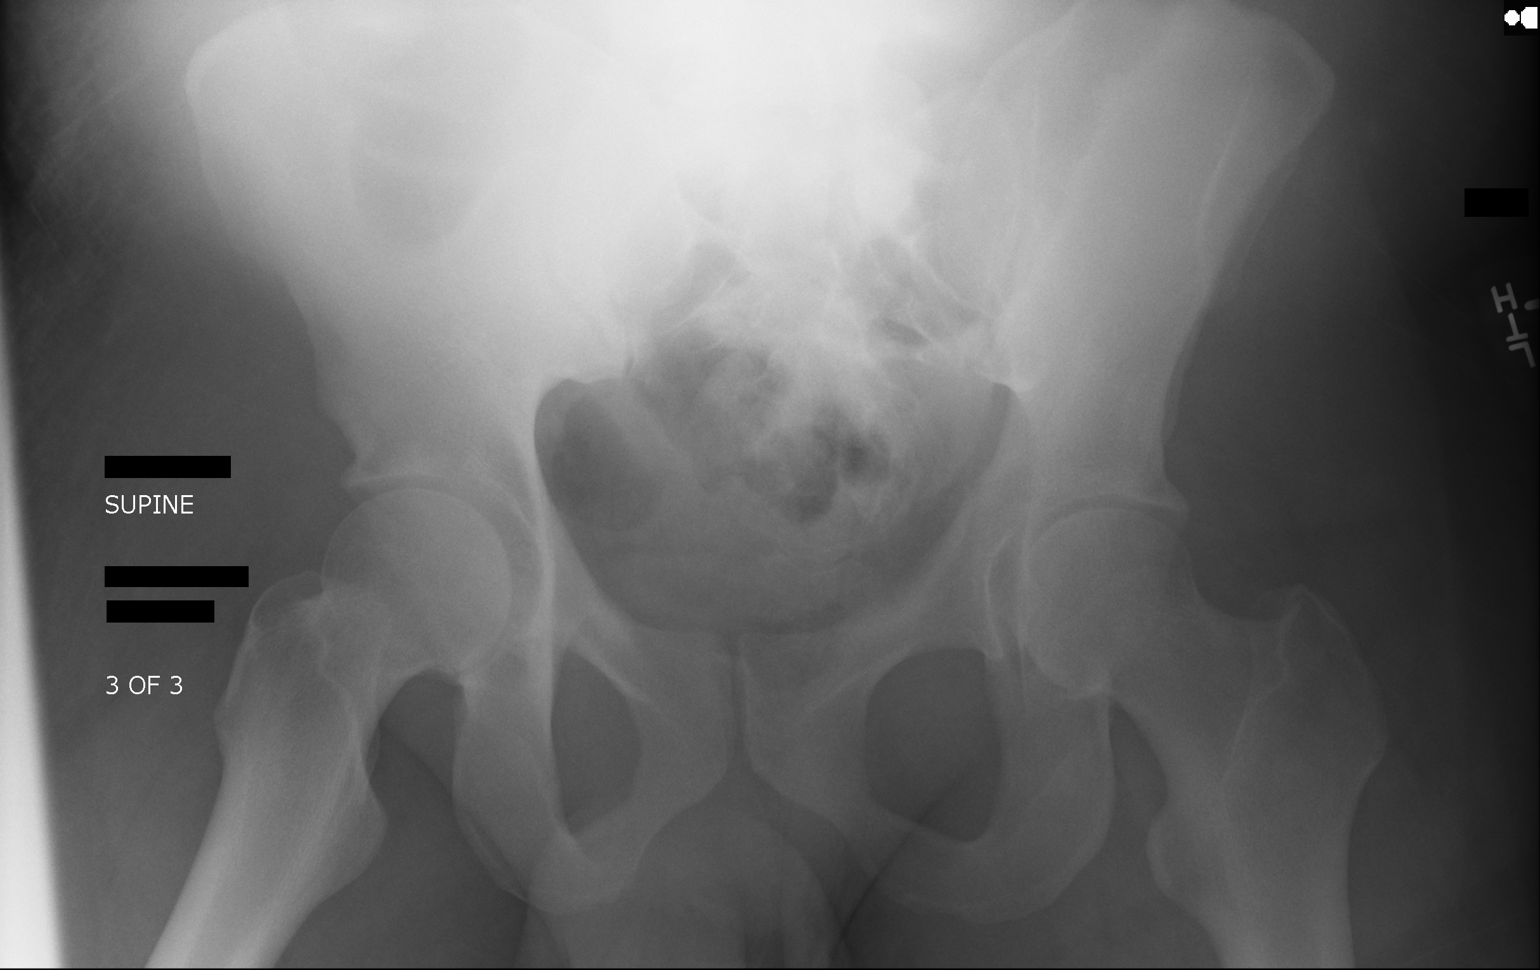

[3 of 3 positions shown; findings below may reference images not displayed]

FINDINGS: The examination is degraded due to over penetration.

There is moderate gaseous distention of the colon. This finding
without associated gaseous distention of the upstream small bowel.

No supine evidence of pneumoperitoneum. No pneumatosis or portal
venous gas.

An enteric tube overlies the upper abdomen.

No acute osseus abnormalities.
IMPRESSION: Degraded examination demonstrates moderate gaseous distention of the
colon presumably secondary to colonic ileus.

## 2015-08-07 IMAGING — CR DG ABDOMEN 1V
1 series · 2 of 2 positions shown · non-contrast
Comparison: May 04, 2014

CLINICAL DATA: Ileus

EXAM:
ABDOMEN - 1 VIEW

[Series 1: ap · 0.17mm/px · 2 of 2 slices shown]
[im 1/2]
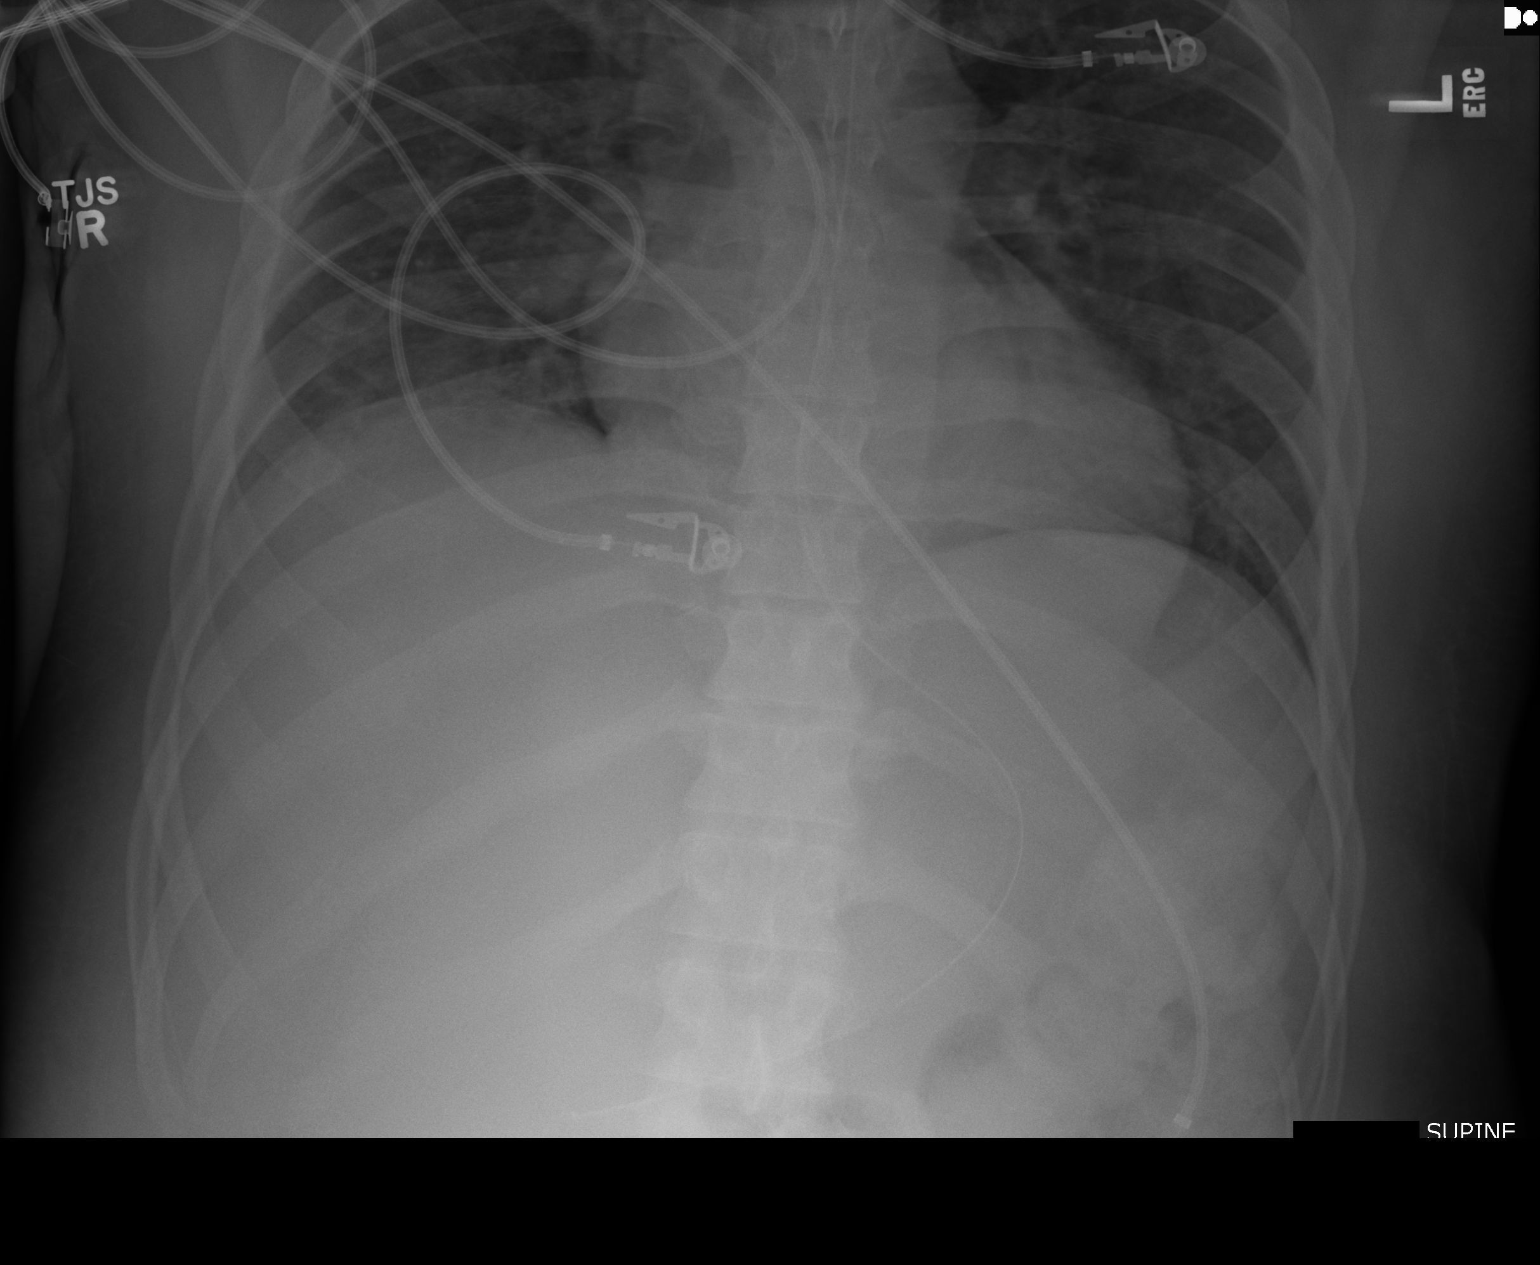
[im 2/2]
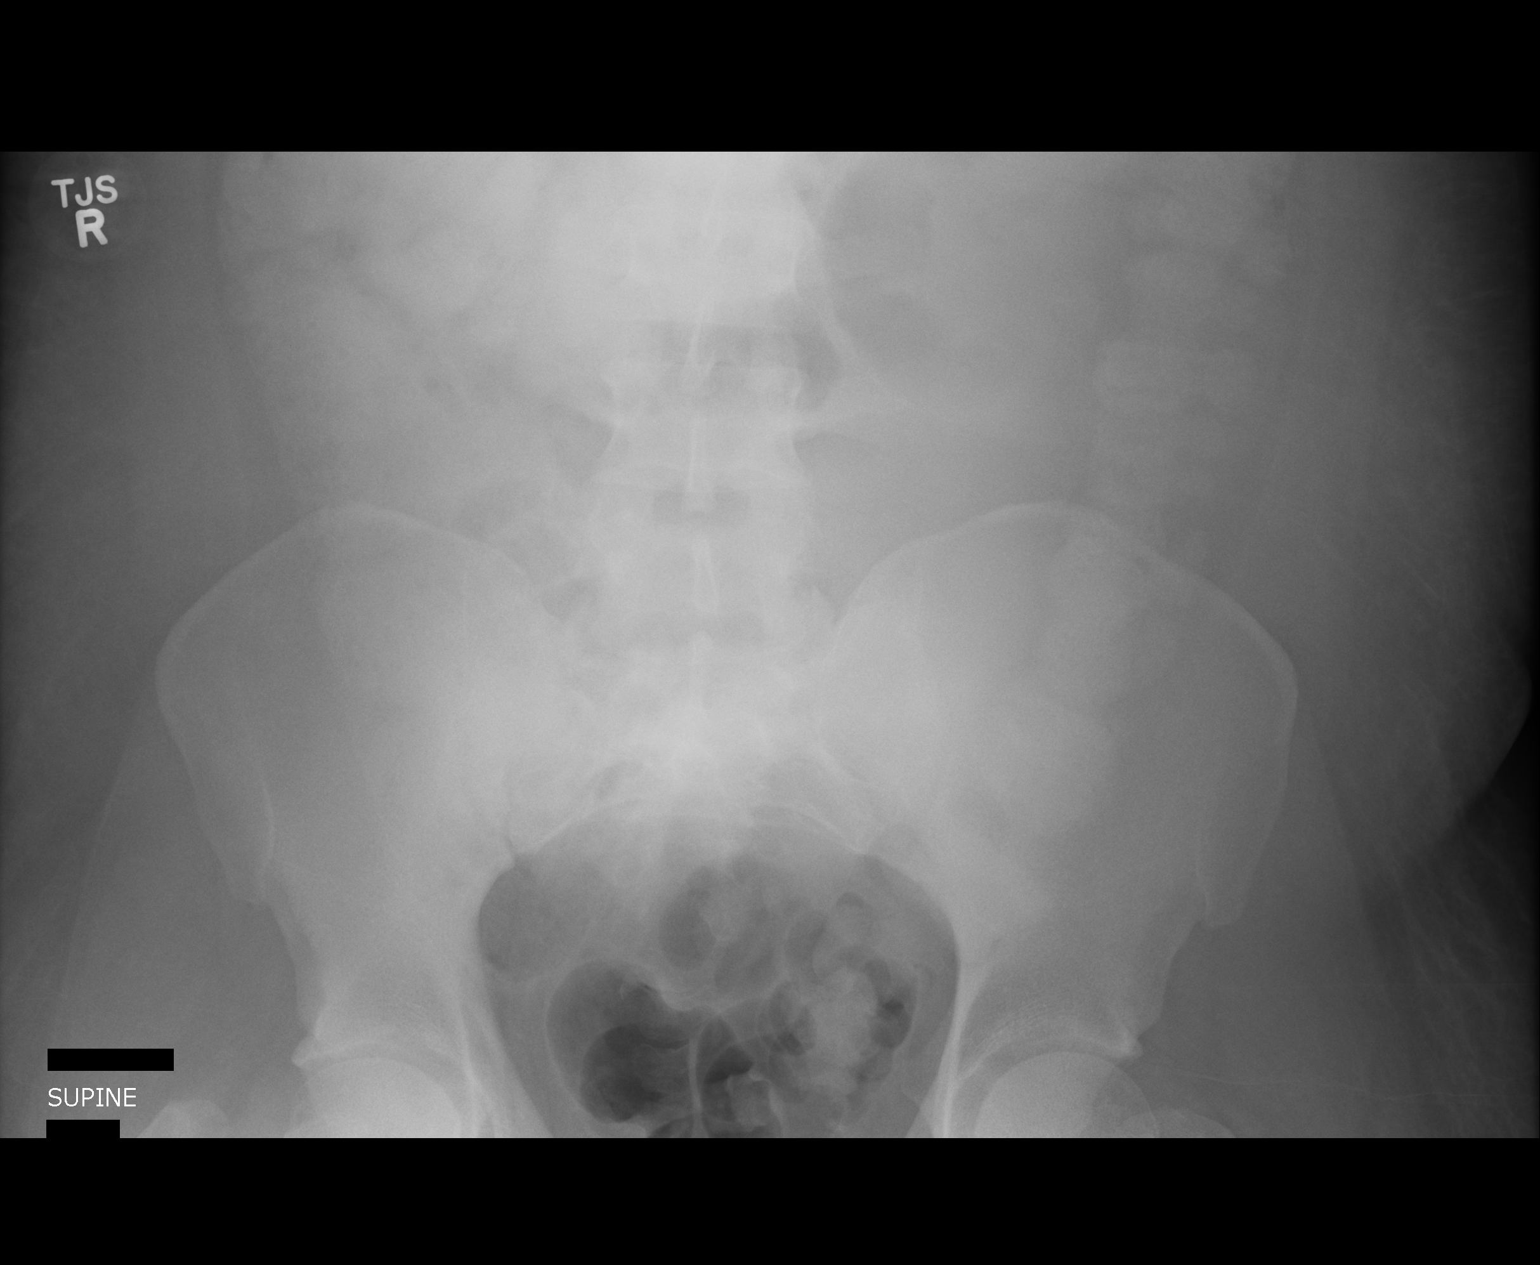

[2 of 2 positions shown; findings below may reference images not displayed]

FINDINGS: Nasogastric tube tip and side port are in the stomach. There is
contrast in the colon. The bowel gas pattern currently is
unremarkable. No obstruction or free air is seen on this supine
examination. There are no abnormal calcifications.
IMPRESSION: Bowel gas pattern unremarkable. Nasogastric tube tip and side port
in stomach. Contrast is seen in the colon.

## 2018-09-15 ENCOUNTER — Emergency Department
Admission: EM | Admit: 2018-09-15 | Discharge: 2018-09-15 | Disposition: A | Discharge status: Home / Self Care | Payer: Self-pay | Attending: Emergency Medicine | Admitting: Emergency Medicine

## 2018-09-15 ENCOUNTER — Encounter: Payer: Self-pay | Admitting: Emergency Medicine

## 2018-09-15 ENCOUNTER — Emergency Department: Payer: Self-pay

## 2018-09-15 ENCOUNTER — Other Ambulatory Visit: Payer: Self-pay

## 2018-09-15 DIAGNOSIS — N50819 Testicular pain, unspecified: Secondary | ICD-10-CM | POA: Insufficient documentation

## 2018-09-15 DIAGNOSIS — F17210 Nicotine dependence, cigarettes, uncomplicated: Secondary | ICD-10-CM | POA: Insufficient documentation

## 2018-09-15 DIAGNOSIS — E119 Type 2 diabetes mellitus without complications: Secondary | ICD-10-CM | POA: Insufficient documentation

## 2018-09-15 HISTORY — DX: Type 2 diabetes mellitus without complications: E11.9

## 2018-09-15 LAB — URINALYSIS, COMPLETE (UACMP) WITH MICROSCOPIC
BACTERIA UA: NONE SEEN
Bilirubin Urine: NEGATIVE
Glucose, UA: 150 mg/dL — AB
Hgb urine dipstick: NEGATIVE
Ketones, ur: NEGATIVE mg/dL
Leukocytes, UA: NEGATIVE
Nitrite: NEGATIVE
Protein, ur: NEGATIVE mg/dL
SPECIFIC GRAVITY, URINE: 1.028 (ref 1.005–1.030)
pH: 6 (ref 5.0–8.0)

## 2018-09-15 LAB — CHLAMYDIA/NGC RT PCR (ARMC ONLY)
CHLAMYDIA TR: NOT DETECTED
N gonorrhoeae: NOT DETECTED

## 2018-09-15 NOTE — ED Notes (Signed)
Bedside with Dr. Cyril Loosen for testicular exam, pt denies pain at this time, UA requested.

## 2018-09-15 NOTE — ED Provider Notes (Signed)
[facility] Emergency Department Provider Note   ____________________________________________    I have reviewed the triage vital signs and the nursing notes.   HISTORY  Chief Complaint Testicle Pain     HPI ERI PLATTEN is a 27 y.o. male who presents with 1 month of intermittent testicular pain.  Patient reports the pain is intermittent, fairly brief and sharp in nature in between his testicles in the scrotum.  No injury.  No swelling.  No penile discharge.  No dysuria.  No fevers.  No nausea or vomiting or abdominal pain.  No groin injury or swelling or pain   Past Medical History:  Diagnosis Date  . Diabetes mellitus without complication (HCC)     There are no active problems to display for this patient.   History reviewed. No pertinent surgical history.  Prior to Admission medications   Not on File     Allergies Other  History reviewed. No pertinent family history.  Social History Social History   Tobacco Use  . Smoking status: Current Every Day Smoker  . Smokeless tobacco: Never Used  Substance Use Topics  . Alcohol use: Yes    Comment: rare  . Drug use: Never    Review of Systems  Constitutional: No fever/chills Eyes: No discharge ENT: No sore throat. Cardiovascular: No palpitations Respiratory: No cough Gastrointestinal: No abdominal pain.  No nausea, no vomiting.   Genitourinary: As above Musculoskeletal: Negative for back pain.  No myalgias Skin: Negative for rash. Neurological: Negative for headaches    ____________________________________________   PHYSICAL EXAM:  VITAL SIGNS: ED Triage Vitals  Enc Vitals Group     BP 09/15/18 1222 120/90     Pulse Rate 09/15/18 1222 (!) 110     Resp 09/15/18 1222 16     Temp 09/15/18 1222 98.1 F (36.7 C)     Temp Source 09/15/18 1222 Oral     SpO2 09/15/18 1222 100 %     Weight 09/15/18 1221 81.6 kg (180 lb)     Height 09/15/18 1221 1.854 m (6\' 1" )     Head  Circumference --      Peak Flow --      Pain Score 09/15/18 1221 8     Pain Loc --      Pain Edu? --      Excl. in GC? --     Constitutional: Alert and oriented. No acute distress.  Eyes: Conjunctivae are normal.   Nose: No congestion/rhinnorhea. Mouth/Throat: Mucous membranes are moist.   Neck:  Painless ROM Cardiovascular: Normal rate, regular rhythm in room,. Grossly normal heart sounds.  Good peripheral circulation. Respiratory: Normal respiratory effort.  No retractions.  Gastrointestinal: Soft and nontender. No distention.   Genitourinary: Normal penis, normal scrotum, no testicular tenderness, no lymphadenopathy, no hernias Musculoskeletal:   Warm and well perfused Neurologic:  Normal speech and language. No gross focal neurologic deficits are appreciated.  Skin:  Skin is warm, dry and intact. No rash noted. Psychiatric: Mood and affect are normal. Speech and behavior are normal.  ____________________________________________   LABS (all labs ordered are listed, but only abnormal results are displayed)  Labs Reviewed  URINALYSIS, COMPLETE (UACMP) WITH MICROSCOPIC - Abnormal; Notable for the following components:      Result Value   Color, Urine YELLOW (*)    APPearance CLEAR (*)    Glucose, UA 150 (*)    All other components within normal limits  CHLAMYDIA/NGC RT PCR ([facility] ONLY)  ____________________________________________  EKG  None ____________________________________________  RADIOLOGY  Ultrasound scrotum unremarkable ____________________________________________   PROCEDURES  Procedure(s) performed: No  Procedures   Critical Care performed: No ____________________________________________   INITIAL IMPRESSION / ASSESSMENT AND PLAN / ED COURSE  Pertinent labs & imaging results that were available during my care of the patient were reviewed by me and considered in my medical decision making (see chart for details).  Patient  well-appearing in no acute distress.  1 month of intermittent testicular pain, exam is normal.  Ultrasound is normal.  Pending urinalysis.  Will likely require follow-up with urology.  Currently the patient has no pain    ____________________________________________   FINAL CLINICAL IMPRESSION(S) / ED DIAGNOSES  Final diagnoses:  Testicle pain        Note:  This document was prepared using Dragon voice recognition software and may include unintentional dictation errors.      Jene Every, MD 09/15/18 [phone]

## 2018-09-15 NOTE — ED Triage Notes (Signed)
Intermittent pain back and forth between right and left testicle X 1 month but more consistent last few days.  No penile discharge. No swelling to testicles per pt. VSS.

## 2018-10-07 ENCOUNTER — Other Ambulatory Visit: Payer: Self-pay

## 2018-10-07 ENCOUNTER — Ambulatory Visit (INDEPENDENT_AMBULATORY_CARE_PROVIDER_SITE_OTHER): Payer: Self-pay | Admitting: Urology

## 2018-10-07 ENCOUNTER — Encounter: Payer: Self-pay | Admitting: Urology

## 2018-10-07 VITALS — BP 115/75 | HR 125 | Ht 74.0 in | Wt 177.0 lb

## 2018-10-07 DIAGNOSIS — N5082 Scrotal pain: Secondary | ICD-10-CM

## 2018-10-07 LAB — URINALYSIS, COMPLETE
BILIRUBIN UA: NEGATIVE
Ketones, UA: NEGATIVE
LEUKOCYTES UA: NEGATIVE
Nitrite, UA: NEGATIVE
PH UA: 6 (ref 5.0–7.5)
PROTEIN UA: NEGATIVE
RBC UA: NEGATIVE
Specific Gravity, UA: 1.02 (ref 1.005–1.030)
Urobilinogen, Ur: 1 mg/dL (ref 0.2–1.0)

## 2018-10-07 NOTE — Progress Notes (Signed)
10/07/2018 11:46 AM   Jackson Anthony June 10, 1991 [id number]  Referring provider: [facility] [address] [place], [place] [address]  CC: Scrotal pain  HPI: I had the pleasure of seeing Jackson Anthony in urology clinic today for scrotal pain.  He is seen in the [facility] ED as well as the [facility] ED over the last month with acute onset of sharp scrotal pain that is intermittent in nature.  There are no aggravating factors.  He denies any trauma to the scrotum, or bicycle or motorcycle riding.  He had 2 scrotal ultrasounds which were both completely benign with no testicular masses or abnormal findings.  Urinalysis and STD testing were both negative.  He was ultimately treated with a week of Cipro at [facility] which he feels may have slightly improved his pain.  His pain varies on the right of the left, and he has difficulty localizing exactly where his pain is.  He thinks it may be worse when he is laying down.  He denies any history of STDs or gross hematuria.  PMH: Past Medical History:  Diagnosis Date  . Diabetes mellitus without complication [facility])     Surgical History: History reviewed. No pertinent surgical history.  Allergies:  Allergies  Allergen Reactions  . Lisinopril Swelling    Angioedema  . Other Anaphylaxis    Anesthesia medication  . Shellfish Allergy Swelling    Angioedema    Family History: Family History  Problem Relation Age of Onset  . Prostate cancer Maternal Uncle   . Prostate cancer Maternal Uncle   . Kidney cancer Neg Hx   . Bladder Cancer Neg Hx     Social History:  reports that he has been smoking. He has never used smokeless tobacco. He reports that he drinks alcohol. He reports that he does not use drugs.  ROS: Please see flowsheet from today's date for complete review of systems.  Physical Exam: BP 115/75   Pulse (!) 125   Ht 6\' 2"  (1.88 m)   Wt 177 lb (80.3 kg)   BMI 22.73 kg/m    Constitutional:  Alert and oriented, No acute  distress. Cardiovascular: No clubbing, cyanosis, or edema. Respiratory: Normal respiratory effort, no increased work of breathing. GI: Abdomen is soft, nontender, nondistended, no abdominal masses GU: No CVA tenderness, phallus without lesions, widely patent meatus Testicles descended bilaterally, approximately 20 cc, no masses.  There is no tenderness to the palpation of any structures in the scrotum.  No skin changes Lymph: There is a mobile, slightly enlarged non-tender right inguinal node, less than 1 cm Skin: No rashes, bruises or suspicious lesions. Neurologic: Grossly intact, no focal deficits, moving all 4 extremities. Psychiatric: Normal mood and affect.  Laboratory Data: Urinalysis today 0 RBCs, 0 WBCs, no bacteria, nitrite negative  Pertinent Imaging: I have personally reviewed the scrotal ultrasound dated 09/15/2018: No testicular masses, no epididymitis, no concerning findings  Assessment & Plan:   In summary, Jackson Anthony is a healthy 27 year old male with approximately 1 month of intermittent scrotal pain, with negative urinalysis and normal scrotal ultrasound.  His only abnormal finding is a slightly enlarged right inguinal lymph node.  We discussed the complexity of scrotal pain in detail and the reassuring nature of his normal scrotal ultrasound and exam.  He has no laboratory or clinical signs of infection.  I recommended a 2-week course of scheduled anti-inflammatories(ibuprofen, Motrin, or Aleve).  I also recommended scrotal elevation, snug fitting underwear, and icing as needed.  He will follow-up in 4 to 6 weeks for a repeat exam regarding his slightly enlarged right inguinal node.  If this continues to enlarge would consider referral to infectious disease or heme-onc.  Other alternatives if he continues to have scrotal pain would be a trial of amitriptyline or gabapentin.   Return in about 4 weeks (around 11/04/2018) for symptom check.  Sondra Come,  MD  [facility] [address], [address] [place], [place] [address] [phone]

## 2018-11-06 ENCOUNTER — Other Ambulatory Visit: Payer: Self-pay

## 2018-11-06 ENCOUNTER — Encounter: Payer: Self-pay | Admitting: Urology

## 2018-11-06 ENCOUNTER — Ambulatory Visit (INDEPENDENT_AMBULATORY_CARE_PROVIDER_SITE_OTHER): Payer: Self-pay | Admitting: Urology

## 2018-11-06 VITALS — BP 124/83 | HR 109 | Ht 74.0 in | Wt 173.3 lb

## 2018-11-06 DIAGNOSIS — N5082 Scrotal pain: Secondary | ICD-10-CM

## 2018-11-06 MED ORDER — AMITRIPTYLINE HCL 25 MG PO TABS: 25.0000 mg | ORAL_TABLET | Freq: Every day | ORAL | 3 refills | Status: DC

## 2018-11-06 NOTE — Progress Notes (Signed)
   11/06/2018 9:04 AM   Jackson Anthony 1991/02/12 [id number]  Reason for visit: Follow up scrotal pain  HPI: I saw Jackson Anthony back in urology clinic to discuss his chronic scrotal pain.  He is a 27 year old African-American male with diabetes and anxiety who is had almost 2 months of chronic scrotal pain.  He has had 2 scrotal ultrasounds that were completely benign.  Urinalysis and STD testing were negative.  At his last visit, we elected to try a 3-week course of scheduled anti-inflammatories, however he has not noted any improvement in his pain.  He reports that his pain continues to alternate between his right and left testicles and behind the scrotum as well as the base of the penis.  He describes it as a dull aching pain that is not improved with anything.  He has continued to wear compression underwear.   ROS: Please see flowsheet from today's date for complete review of systems.  Physical Exam: BP 124/83   Pulse (!) 109   Ht 6\' 2"  (1.88 m)   Wt 173 lb 4.8 oz (78.6 kg)   BMI 22.25 kg/m    Constitutional:  Alert and oriented, No acute distress. Respiratory: Normal respiratory effort, no increased work of breathing. GI: Abdomen is soft, nontender, nondistended, no abdominal masses GU: No CVA tenderness, phallus without lesions, widely patent meatus, testicles descended and 20 cc bilaterally, no masses.  Testicles, epididymis, and cords are nontender bilaterally. Non-tender, mobile, <1 cm palpable inguinal nodes bilaterally, have not increased in size from prior exam. Skin: No rashes, bruises or suspicious lesions. Neurologic: Grossly intact, no focal deficits, moving all 4 extremities. Psychiatric: Normal mood and affect  Assessment & Plan:   In summary, Jackson Anthony is a 27 year old male with 1986-month history of chronic scrotal and penile pain that varies in location.  His exam and scrotal ultrasounds have been benign.  He did not get any relief with a 3-week course of scheduled  NSAIDs.  He does feel that his anxiety and stress may be contributing to his scrotal pain.  We discussed at length the unclear etiology of his pain, however the reassuring findings of negative infectious work-up and scrotal ultrasounds.  Trial of Amitriptyline 25mg  daily Referral to pelvic floor physical therapy RTC 6 weeks for symptom check  Sondra ComeBrian C Anissa Abbs, MD  [facility] [address], [address] [place], [place] [id number] [phone]

## 2018-12-09 ENCOUNTER — Ambulatory Visit: Payer: Self-pay | Attending: Urology | Admitting: Physical Therapy

## 2018-12-17 ENCOUNTER — Encounter: Payer: Self-pay | Admitting: Physical Therapy

## 2018-12-18 ENCOUNTER — Encounter: Payer: Self-pay | Admitting: Urology

## 2018-12-18 ENCOUNTER — Ambulatory Visit: Payer: Self-pay | Admitting: Urology

## 2018-12-23 ENCOUNTER — Encounter: Payer: Self-pay | Admitting: Physical Therapy

## 2018-12-30 ENCOUNTER — Encounter: Payer: Self-pay | Admitting: Physical Therapy

## 2019-01-06 ENCOUNTER — Encounter: Payer: Self-pay | Admitting: Physical Therapy

## 2019-01-13 ENCOUNTER — Encounter: Payer: Self-pay | Admitting: Physical Therapy

## 2019-06-19 ENCOUNTER — Ambulatory Visit: Payer: Self-pay | Admitting: Urology

## 2019-06-26 ENCOUNTER — Ambulatory Visit (INDEPENDENT_AMBULATORY_CARE_PROVIDER_SITE_OTHER): Payer: Self-pay | Admitting: Urology

## 2019-06-26 ENCOUNTER — Encounter: Payer: Self-pay | Admitting: Urology

## 2019-06-26 ENCOUNTER — Other Ambulatory Visit: Payer: Self-pay

## 2019-06-26 VITALS — BP 130/85 | HR 134 | Ht 74.0 in | Wt 166.0 lb

## 2019-06-26 DIAGNOSIS — M6289 Other specified disorders of muscle: Secondary | ICD-10-CM

## 2019-06-26 MED ORDER — AMITRIPTYLINE HCL 25 MG PO TABS: 25.0000 mg | ORAL_TABLET | Freq: Every day | ORAL | 3 refills | Status: AC

## 2019-06-26 NOTE — Progress Notes (Signed)
   06/26/2019 5:57 PM   Jackson Anthony December 11, 1990 [id number]  Reason for visit: Follow up pelvic pain  HPI: I saw Jackson Anthony back in urology clinic for discussion of his chronic pelvic pain.  He is a 28 year old African-American male with diabetes and anxiety who is had 9+ months of chronic pelvic pain.  He has had multiple negative scrotal ultrasounds, and urinalysis and STD testing have been negative.  His primary complaint is intermittent aching and pain behind the scrotum and the perineum.  This is improved with soaking in the tub and smoking marijuana.  He is unsure if anything makes his symptoms worse.  He also has noted some difficulty with erections since this started, but he is not interested in trying any medications for this.  At our last visit in December 2019 I had recommended a trial of amitriptyline 25 mg daily as well as referral to pelvic floor physical therapy.  He did not try either of these recommendations, and did not follow-up as scheduled.   ROS: Please see flowsheet from today's date for complete review of systems.  Physical Exam: BP 130/85   Pulse (!) 134   Ht 6\' 2"  (1.88 m)   Wt 166 lb (75.3 kg)   BMI 21.31 kg/m    Constitutional:  Alert and oriented, No acute distress. Respiratory: Normal respiratory effort, no increased work of breathing. GI: Abdomen is soft, nontender, nondistended, no abdominal masses GU: No CVA tenderness, phallus without lesions, widely patent meatus.  Testicles descended and 20 cc bilaterally, nontender, no masses.  No perineal tenderness. Skin: No rashes, bruises or suspicious lesions. Neurologic: Grossly intact, no focal deficits, moving all 4 extremities. Psychiatric: Normal mood and affect  Assessment & Plan:   In summary, the patient is a 28 year old male with chronic pelvic pain focused primarily in the perineum.  We discussed the complex etiology of pelvic pain at length, and I strongly recommended he follow-up with pelvic  floor physical therapy.  I also recommended a trial of amitriptyline as recommended previously.  Referral placed to pelvic floor physical therapy Pelvic floor dysfunction information handout provided today Amitriptyline 25 mg nightly RTC 3 months for symptom check  A total of 15 minutes were spent face-to-face with the patient, greater than 50% was spent in patient education, counseling, and coordination of care regarding pelvic floor dysfunction and treatment strategies.   Billey Co, [facility] [address] [place], [place] [address] [phone]

## 2019-06-26 NOTE — Patient Instructions (Signed)
Pelvic Floor Dysfunction  Pelvic floor dysfunction (PFD) is a condition that results when the group of muscles and connective tissues that support the organs in the pelvis (pelvic floor muscles) do not work well. These muscles and their connections form a sling that supports the colon and bladder. In men, these muscles also support the prostate gland. In women, they also support the uterus. PFD causes pelvic floor muscles to be too weak, too tight, or a combination of both. In PFD, muscle movements are not coordinated. This condition may cause bowel or bladder problems. It may also cause pain. What are the causes? This condition may be caused by an injury to the pelvic area or by a weakening of pelvic muscles. This often results from pregnancy and childbirth or other types of strain. In many cases, the exact cause is not known. What increases the risk? The following factors may make you more likely to develop this condition:  Having a condition of chronic bladder tissue inflammation (interstitial cystitis).  Being an older person.  Being overweight.  Radiation treatment for cancer in the pelvic region.  Previous pelvic surgery, such as removal of the uterus (hysterectomy) or prostate gland (prostatectomy). What are the signs or symptoms? Symptoms of this condition vary and may include:  Bladder symptoms, such as: ? Trouble starting urination and emptying the bladder. ? Frequent urinary tract infections. ? Leaking urine when coughing, laughing, or exercising (stress incontinence). ? Having to pass urine urgently or frequently. ? Pain when passing urine.  Bowel symptoms, such as: ? Constipation. ? Urgent or frequent bowel movements. ? Incomplete bowel movements. ? Painful bowel movements. ? Leaking stool or gas.  Unexplained genital or rectal pain.  Genital or rectal muscle spasms.  Low back pain. In women, symptoms of PFD may also include:  A heavy, full, or aching feeling in  the vagina.  A bulge that protrudes into the vagina.  Pain during or after sexual intercourse. How is this diagnosed? This condition may be diagnosed based on:  Your symptoms and medical history.  A physical exam. During the exam, your health care provider may check your pelvic muscles for tightness, spasm, pain, or weakness. This may include a rectal exam and a pelvic exam for women. In some cases, you may have diagnostic tests, such as:  Electrical muscle function tests.  Urine flow testing.  X-ray tests of bowel function.  Ultrasound of the pelvic organs. How is this treated? Treatment for this condition depends on your symptoms. Treatment options include:  Physical therapy. This may include Kegel exercises to help relax or strengthen the pelvic floor muscles.  Biofeedback. This type of therapy provides feedback on how tight your pelvic floor muscles are so that you can learn to control them.  Internal or external massage therapy.  A treatment that involves electrical stimulation of the pelvic floor muscles to help control pain (transcutaneous electrical nerve stimulation, or TENS).  Sound wave therapy (ultrasound) to reduce muscle spasms.  Medicines, such as: ? Muscle relaxants. ? Bladder control medicines. Surgery to reconstruct or support pelvic floor muscles may be an option if other treatments do not help. Follow these instructions at home: Activity  Do your usual activities as told by your health care provider. Ask your health care provider if you should modify any activities.  Do pelvic floor strengthening or relaxing exercises at home as told by your physical therapist. Lifestyle  Maintain a healthy weight.  Eat foods that are high in fiber, such as   beans, whole grains, and fresh fruits and vegetables.  Limit foods that are high in fat and processed sugars, such as fried or sweet foods.  Manage stress with relaxation techniques such as yoga or meditation.  General instructions  If you have problems with leakage: ? Use absorbable pads or wear padded underwear. ? Wash frequently with mild soap. ? Keep your genital and anal area as clean and dry as possible. ? Ask your health care provider if you should try a barrier cream to prevent skin irritation.  Take warm baths to relieve pelvic muscle tension or spasms.  Take over-the-counter and prescription medicines only as told by your health care provider.  Keep all follow-up visits as told by your health care provider. This is important. Contact a health care provider if you:  Are not improving with home care.  Have signs or symptoms of PFD that get worse at home.  Develop new signs or symptoms at home.  Have signs of a urinary tract infection, such as: ? Fever. ? Chills. ? Urinary frequency. ? A burning feeling when urinating.  Have not had a bowel movement in 3 days (constipation). Summary  Pelvic floor dysfunction results when the muscles and connective tissues in your pelvic floor do not work well.  These muscles and their connections form a sling that supports your colon and bladder. In men, these muscles also support the prostate gland. In women, they also support the uterus.  PFD may be caused by an injury to the pelvic area or by a weakening of pelvic muscles.  PFD causes pelvic floor muscles to be too weak, too tight, or a combination of both. Symptoms may vary from person to person.  In most cases, PFD can be treated with physical therapies and medicines. Surgery may be an option if other treatments do not help. This information is not intended to replace advice given to you by your health care provider. Make sure you discuss any questions you have with your health care provider. Document Released: 06/10/2018 Document Revised: 06/10/2018 Document Reviewed: 06/10/2018 Elsevier Patient Education  2020 [employer]  General Guidelines for Pelvic Floor  Exercise  Challenge your muscles to do more than they are used to doing. The quality of the exercise is more important that the number you perform.  Avoid straining, holding your breath or using buttock or leg muscles while you exercise the pelvic floor muscles.  Count out loud and continue breathing to avoid straining.  Relax your body and breathe during your exercises.  Coordinate your breathing with your pelvic floor contraction by blowing out or exhaling while you contract your pelvic floor muscles.   Concentrate on activating both the sphincters and levator ani muscles of the pelvic floor with each exercise.  Position for the Exercises  Start lying down with your knees bent and supported with pillows.  Once you've gained awareness and can feel the contractions you may perform the exercises either sitting or standing.  For example, you can do them while driving, working on the computer, or waiting in lines.

## 2019-10-02 ENCOUNTER — Ambulatory Visit: Payer: Self-pay | Admitting: Urology

## 2019-12-18 IMAGING — US US ART/VEN ABD/PELV/SCROTUM DOPPLER LTD
1 series · 14 of 25 positions shown · non-contrast
Comparison: None.

CLINICAL DATA: Testicular pain

EXAM:
SCROTAL ULTRASOUND
DOPPLER ULTRASOUND OF THE TESTICLES
TECHNIQUE: Complete ultrasound examination of the testicles, epididymis, and
other scrotal structures was performed. Color and spectral Doppler
ultrasound were also utilized to evaluate blood flow to the
testicles.

[Series 1: us art/ven abd/pelv/scrotum doppler ltd · 14 of 60 slices shown]
[im 1/60]
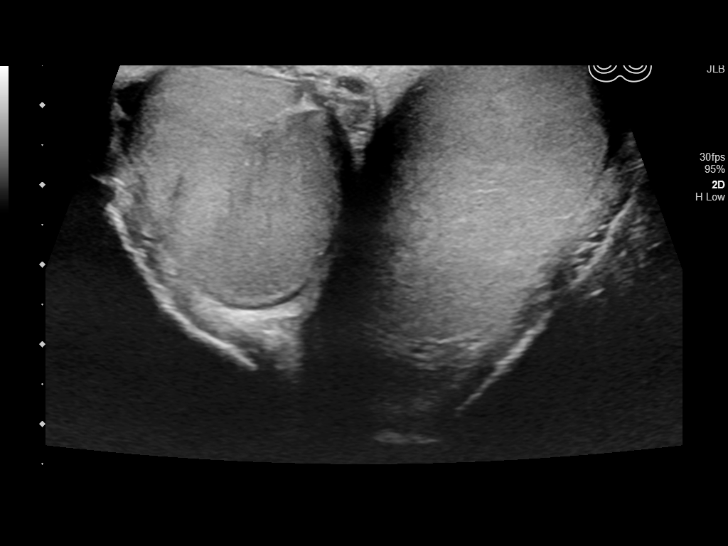
[im 5/60]
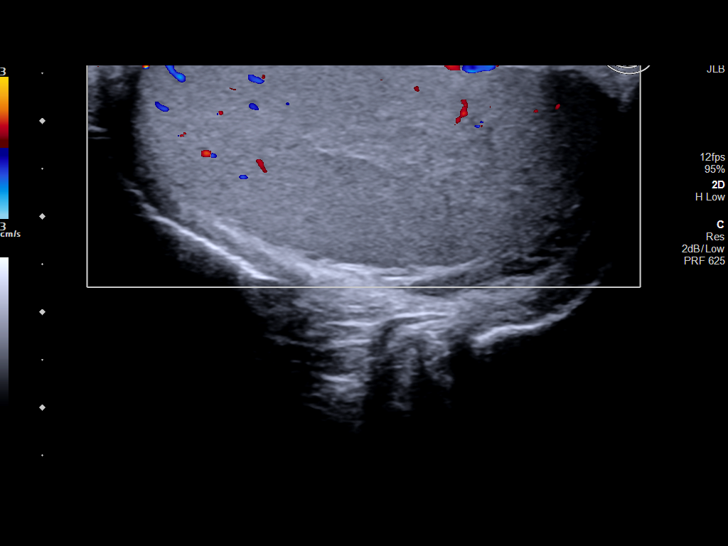
[im 10/60]
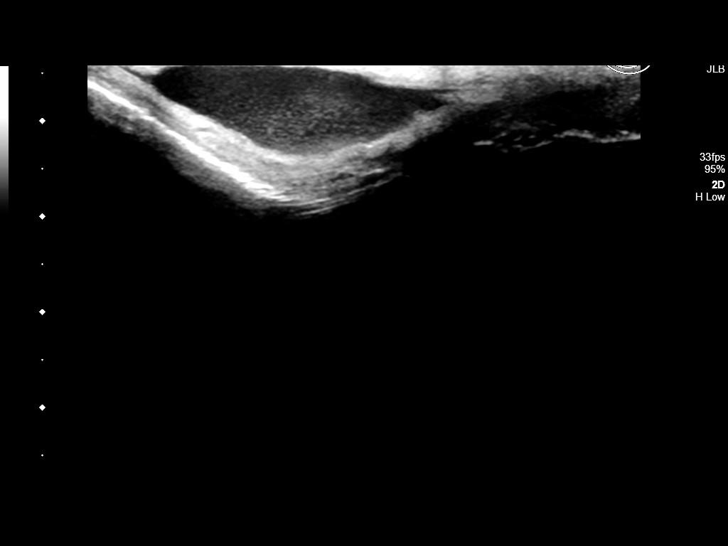
[im 15/60]
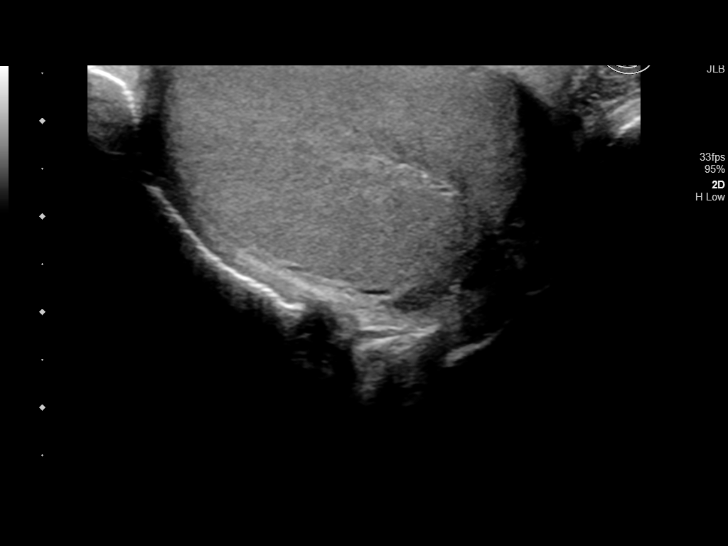
[im 20/60]
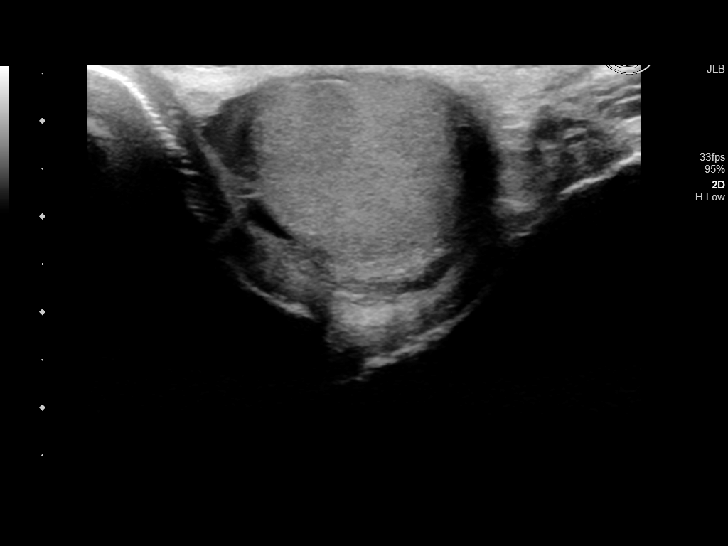
[im 23/60]
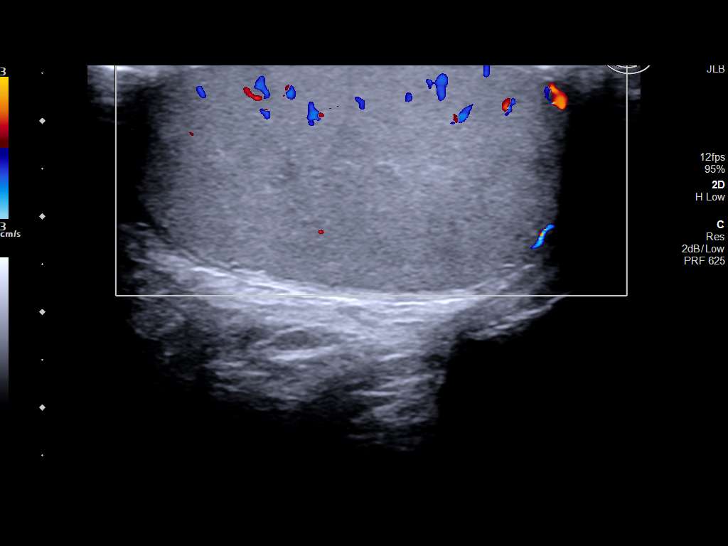
[im 28/60]
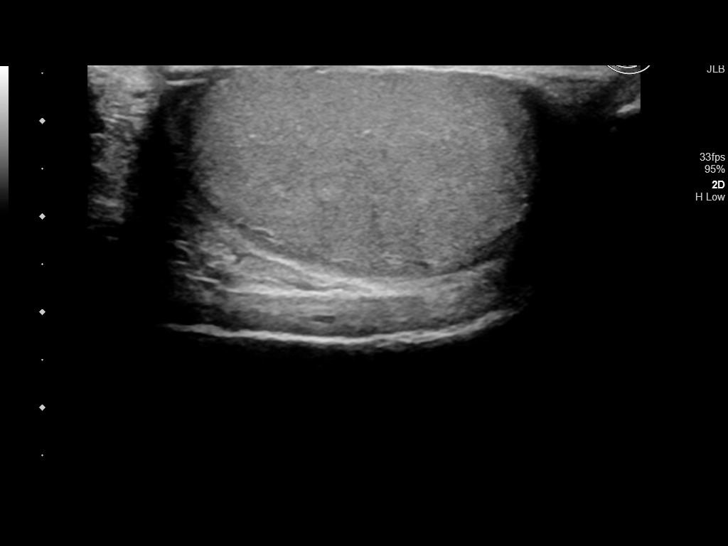
[im 32/60]
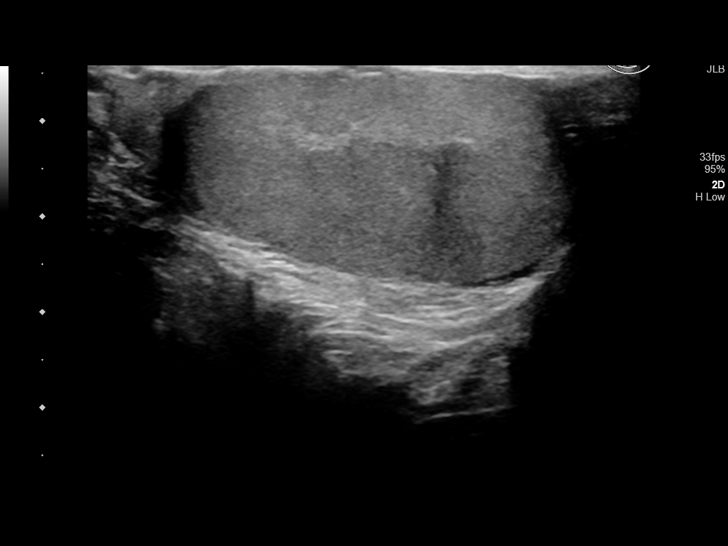
[im 37/60]
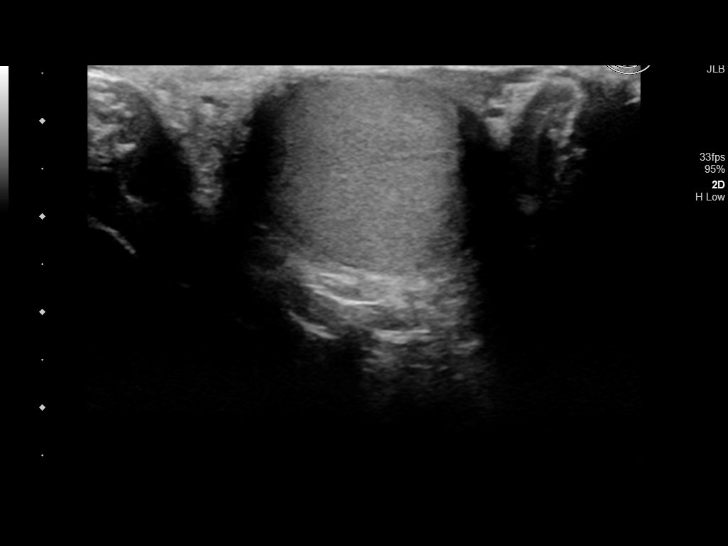
[im 40/60]
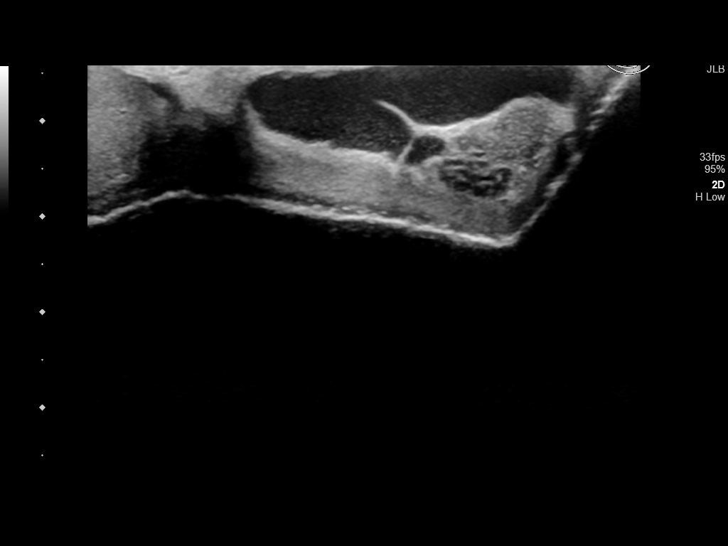
[im 45/60]
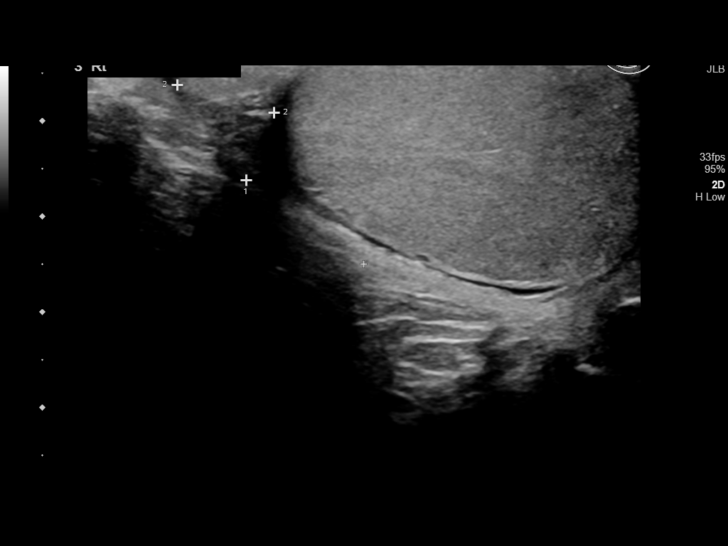
[im 50/60]
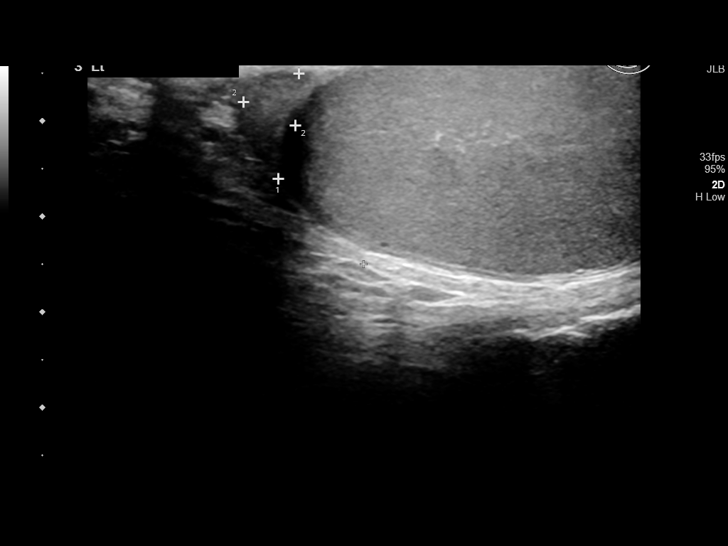
[im 55/60]
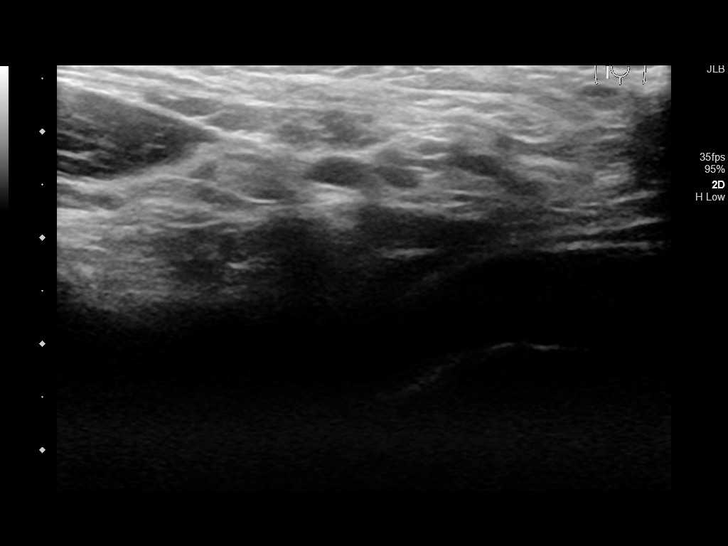
[im 60/60]
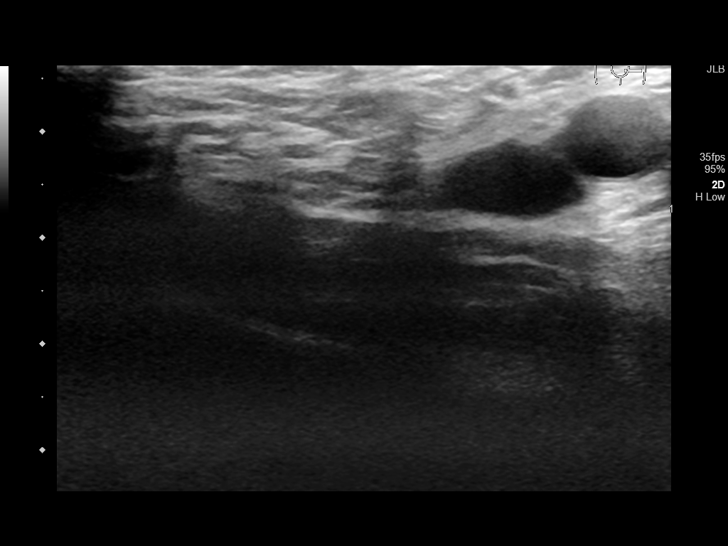

[14 of 25 positions shown; findings below may reference images not displayed]

FINDINGS: Right testicle

Measurements: 5.0 x 2.2 x 3.7 cm. No mass or microlithiasis
visualized.

Left testicle

Measurements: 4.5 x 2.4 x 2.9 cm. No mass or microlithiasis
visualized.

Right epididymis:  Normal in size and appearance.

Left epididymis:  Normal in size and appearance.

Hydrocele:  Small bilateral hydroceles may be physiologic.

Varicocele:  None visualized.

Pulsed Doppler interrogation of both testes demonstrates normal low
resistance arterial and venous waveforms bilaterally. Normal color
Doppler signal.
IMPRESSION: Normal appearance of the testicles, without explanation for pain.

Small bilateral hydroceles, most likely physiologic.

## 2020-07-02 ENCOUNTER — Ambulatory Visit: Payer: HRSA Program | Attending: Internal Medicine

## 2020-07-02 DIAGNOSIS — Z20822 Contact with and (suspected) exposure to covid-19: Secondary | ICD-10-CM | POA: Insufficient documentation

## 2020-07-03 LAB — SARS-COV-2, NAA 2 DAY TAT

## 2020-07-03 LAB — NOVEL CORONAVIRUS, NAA: SARS-CoV-2, NAA: NOT DETECTED

## 2021-04-11 ENCOUNTER — Emergency Department
Admission: EM | Admit: 2021-04-11 | Discharge: 2021-04-11 | Disposition: A | Discharge status: Home / Self Care | Payer: Self-pay | Attending: Emergency Medicine | Admitting: Emergency Medicine

## 2021-04-11 ENCOUNTER — Other Ambulatory Visit: Payer: Self-pay

## 2021-04-11 ENCOUNTER — Encounter: Payer: Self-pay | Admitting: *Deleted

## 2021-04-11 DIAGNOSIS — K029 Dental caries, unspecified: Secondary | ICD-10-CM | POA: Insufficient documentation

## 2021-04-11 DIAGNOSIS — E119 Type 2 diabetes mellitus without complications: Secondary | ICD-10-CM | POA: Insufficient documentation

## 2021-04-11 DIAGNOSIS — F17200 Nicotine dependence, unspecified, uncomplicated: Secondary | ICD-10-CM | POA: Insufficient documentation

## 2021-04-11 MED ORDER — ACETAMINOPHEN 325 MG PO TABS
650.0000 mg | ORAL_TABLET | Freq: Once | ORAL | Status: AC
  Administered 2021-04-11: 650 mg via ORAL
  Filled 2021-04-11: qty 2

## 2021-04-11 MED ORDER — LIDOCAINE VISCOUS HCL 2 % MT SOLN: 15.0000 mL | OROMUCOSAL | 0 refills | Status: AC | PRN

## 2021-04-11 MED ORDER — IBUPROFEN 600 MG PO TABS
600.0000 mg | ORAL_TABLET | Freq: Once | ORAL | Status: AC
  Administered 2021-04-11: 600 mg via ORAL
  Filled 2021-04-11: qty 1

## 2021-04-11 MED ORDER — LIDOCAINE VISCOUS HCL 2 % MT SOLN
15.0000 mL | Freq: Once | OROMUCOSAL | Status: AC
  Administered 2021-04-11: 15 mL via OROMUCOSAL
  Filled 2021-04-11: qty 15

## 2021-04-11 MED ORDER — AMOXICILLIN-POT CLAVULANATE 875-125 MG PO TABS
1.0000 | ORAL_TABLET | Freq: Once | ORAL | Status: AC
  Administered 2021-04-11: 1 via ORAL
  Filled 2021-04-11: qty 1

## 2021-04-11 MED ORDER — AMOXICILLIN-POT CLAVULANATE 875-125 MG PO TABS: 1.0000 | ORAL_TABLET | Freq: Two times a day (BID) | ORAL | 0 refills | Status: AC

## 2021-04-11 NOTE — ED Provider Notes (Signed)
[facility] Emergency Department Provider Note  ____________________________________________   Event Date/Time   First MD Initiated Contact with Patient 04/11/21 1622     (approximate)  I have reviewed the triage vital signs and the nursing notes.   HISTORY  Chief Complaint Dental Pain   HPI Jackson Anthony is a 30 y.o. male who presents to the emergency department for evaluation of left lower dental pain that is been present for the last 4 days.  He denies any fevers, facial swelling.  Reports pain is worse when biting down.  Had history of similar to the right lower jawline several years ago, no history of similar on the left.       Past Medical History:  Diagnosis Date  . Diabetes mellitus without complication [facility])     Patient Active Problem List   Diagnosis Date Noted  . Scrotal pain 10/07/2018    No past surgical history on file.  Prior to Admission medications   Medication Sig Start Date End Date Taking? Authorizing Provider  amoxicillin-clavulanate (AUGMENTIN) 875-125 MG tablet Take 1 tablet by mouth every 12 (twelve) hours for 7 days. 04/11/21 04/18/21 Yes Jillyan Plitt, Ruben Gottron, PA  lidocaine (XYLOCAINE) 2 % solution Use as directed 15 mLs in the mouth or throat as needed for mouth pain. 04/11/21  Yes Lucy Chris, PA  amitriptyline (ELAVIL) 25 MG tablet Take 1 tablet (25 mg total) by mouth at bedtime. 06/26/19   Sondra Come, MD    Allergies Lisinopril, Other, and Shellfish allergy  Family History  Problem Relation Age of Onset  . Prostate cancer Maternal Uncle   . Prostate cancer Maternal Uncle   . Kidney cancer Neg Hx   . Bladder Cancer Neg Hx     Social History Social History   Tobacco Use  . Smoking status: Current Every Day Smoker  . Smokeless tobacco: Never Used  Substance Use Topics  . Alcohol use: Not Currently    Comment: rare  . Drug use: Never    Review of Systems Constitutional: No fever/chills Eyes: No  visual changes. ENT: + Left lower dental pain, no sore throat. Cardiovascular: Denies chest pain. Respiratory: Denies shortness of breath. Gastrointestinal: No abdominal pain.  No nausea, no vomiting.  No diarrhea.  No constipation. Genitourinary: Negative for dysuria. Musculoskeletal: Negative for back pain. Skin: Negative for rash. Neurological: Negative for headaches, focal weakness or numbness.   ____________________________________________   PHYSICAL EXAM:  VITAL SIGNS: ED Triage Vitals  Enc Vitals Group     BP 04/11/21 1604 134/86     Pulse Rate 04/11/21 1604 83     Resp 04/11/21 1604 18     Temp 04/11/21 1604 98.5 F (36.9 C)     Temp Source 04/11/21 1604 Oral     SpO2 04/11/21 1604 95 %     Weight 04/11/21 1604 220 lb (99.8 kg)     Height 04/11/21 1604 6\' 4"  (1.93 m)     Head Circumference --      Peak Flow --      Pain Score 04/11/21 1606 8     Pain Loc --      Pain Edu? --      Excl. in GC? --    Constitutional: Alert and oriented. Well appearing and in no acute distress. Eyes: Conjunctivae are normal. PERRL. EOMI. Head: Atraumatic. Nose: No congestion/rhinnorhea. Mouth/Throat: Mucous membranes are moist.  Oropharynx non-erythematous.  There are multiple dental caries and missing teeth throughout  the oropharynx.  There is a broken tooth in the left posterior molars, at the site of the patient's pain.  No obvious periapical abscess present.  No pain under the tongue, no submandibular lymphadenopathy. Neck: No stridor.   Cardiovascular: Normal rate, regular rhythm. Grossly normal heart sounds.  Good peripheral circulation. Respiratory: Normal respiratory effort.  No retractions. Lungs CTAB. Neurologic:  Normal speech and language. No gross focal neurologic deficits are appreciated. No gait instability. Skin:  Skin is warm, dry and intact. No rash noted. Psychiatric: Mood and affect are normal. Speech and behavior are  normal.   ____________________________________________   INITIAL IMPRESSION / ASSESSMENT AND PLAN / ED COURSE  As part of my medical decision making, I reviewed the following data within the electronic MEDICAL RECORD NUMBER Nursing notes reviewed and incorporated and Notes from prior ED visits        Patient presents for 4 days of left dental pain.  He has having pain in the left lower gumline.  Denies fever, facial swelling or other systemic symptoms.  On exam, he has no facial swelling, there are multiple caries and broken teeth noted throughout the oropharynx, with one of the site of his pain on the left lower gumline.  No obvious periapical abscess.  Will initiate course of Augmentin, provide the patient with viscous lidocaine and recommend that he take anti-inflammatories and Tylenol in conjunction with these for pain relief at home.  Patient was given a list of community dental providers and given return precautions.  Patient is agreeable with plan and stable time for outpatient management.      ____________________________________________   FINAL CLINICAL IMPRESSION(S) / ED DIAGNOSES  Final diagnoses:  Pain due to dental caries     ED Discharge Orders         Ordered    amoxicillin-clavulanate (AUGMENTIN) 875-125 MG tablet  Every 12 hours        04/11/21 1637    lidocaine (XYLOCAINE) 2 % solution  As needed        04/11/21 1637          *Please note:  JORAN KALLAL was evaluated in Emergency Department on 04/11/2021 for the symptoms described in the history of present illness. He was evaluated in the context of the global COVID-19 pandemic, which necessitated consideration that the patient might be at risk for infection with the SARS-CoV-2 virus that causes COVID-19. Institutional protocols and algorithms that pertain to the evaluation of patients at risk for COVID-19 are in a state of rapid change based on information released by regulatory bodies including the CDC and federal  and state organizations. These policies and algorithms were followed during the patient's care in the ED.  Some ED evaluations and interventions may be delayed as a result of limited staffing during and the pandemic.*   Note:  This document was prepared using Dragon voice recognition software and may include unintentional dictation errors.   Lucy Chris, PA 04/11/21 1643    Merwyn Katos, MD 04/11/21 [phone]

## 2021-04-11 NOTE — ED Notes (Signed)
See triage note  Presents with possible dental abscess  Having pain to left lower gum line  States pain started 1 week ago

## 2021-04-11 NOTE — Discharge Instructions (Addendum)
Please make an appointment with the local dental clinic as below. Take your antibiotics as instructed. Please use ibuprofen, 600mg  3x daily as well as Tylenol, 1000mg  up to 4 times daily. You may also use the lidocaine as needed for your pain. Return to ER with any worsening.   OPTIONS FOR DENTAL FOLLOW UP CARE  [place] Department of Health and Human Services - Local Safety Net Dental Clinics .htm   [facility] [phone])  [facility] [phone])  [place] [phone])  [facility] [phone])  [facility] [phone]) This clinic caters to the indigent population and is on a lottery system. Location: [facility] of Dentistry, [phone], [address], [place] Clinic Hours: Wednesdays from 6pm - 9pm, patients seen by a lottery system. For dates, call or go to [place] Services: Cleanings, fillings and simple extractions. Payment Options: DENTAL WORK IS FREE OF CHARGE. Bring proof of income or support. Best way to get seen: Arrive at 5:15 pm - this is a lottery, NOT first come/first serve, so arriving earlier will not increase your chances of being seen.     [facility] [phone] Select option 1 for emergencies   Location: [facility] of Dentistry, [place], [address] [place] Clinic Hours: No walk-ins accepted - call the day before to schedule an appointment. Check in times are 9:30 am and 1:30 pm. Services: Simple extractions, temporary fillings, pulpectomy/pulp debridement, uncomplicated abscess drainage. Payment Options: PAYMENT IS DUE AT THE TIME OF SERVICE.  Fee is usually $100-200, additional surgical procedures (e.g. abscess drainage) may be extra. Cash, checks, Visa/MasterCard accepted.  Can file Medicaid if patient is covered for dental -  patient should call case worker to check. No discount for [facility] patients. Best way to get seen: MUST call the day before and get onto the schedule. Can usually be seen the next 1-2 days. No walk-ins accepted.     [facility] [phone]   Location: [facility], [address], [place] Clinic Hours: M, W, Th, F 8am or 1:30pm, Tues 9a or 1:30 - first come/first served. Services: Simple extractions, temporary fillings, uncomplicated abscess drainage.  You do not need to be an [facility] resident. Payment Options: PAYMENT IS DUE AT THE TIME OF SERVICE. Dental insurance, otherwise sliding scale - bring proof of income or support. Depending on income and treatment needed, cost is usually $50-200. Best way to get seen: Arrive early as it is first come/first served.     [facility] [phone]   Location: [address] Clinic Hours: Mon-Thu 8a-5p Services: Most basic dental services including extractions and fillings. Payment Options: PAYMENT IS DUE AT THE TIME OF SERVICE. Sliding scale, up to 50% off - bring proof if income or support. Medicaid with dental option accepted. Best way to get seen: Call to schedule an appointment, can usually be seen within 2 weeks OR they will try to see walk-ins - show up at 8a or 2p (you may have to wait).     [facility] [phone] [place] RESIDENTS ONLY   Location: [facility], [address] [place], [address] Clinic Hours: By appointment only. Monday - Thursday 8am-5pm, Friday 8am-12pm Services: Cleanings, fillings, extractions. Payment Options: PAYMENT IS DUE AT THE TIME OF SERVICE. Cash, Visa or MasterCard. Sliding scale - $30 minimum per service. Best way to get seen: Come  in to office, complete packet and make an appointment - need proof of income or support monies for each household  member and proof of [facility] residence. Usually takes about a month to get in.     [facility] [phone]   Location: [address] [facility] Clinic Hours: Walk-in Urgent Care Dental Services are offered Monday-Friday mornings only. The numbers of emergencies accepted daily is limited to the number of providers available. Maximum 15 - Mondays, Wednesdays & Thursdays Maximum 10 - Tuesdays & Fridays Services: You do not need to be a [facility] resident to be seen for a dental emergency. Emergencies are defined as pain, swelling, abnormal bleeding, or dental trauma. Walkins will receive x-rays if needed. NOTE: Dental cleaning is not an emergency. Payment Options: PAYMENT IS DUE AT THE TIME OF SERVICE. Minimum co-pay is $40.00 for uninsured patients. Minimum co-pay is $3.00 for Medicaid with dental coverage. Dental Insurance is accepted and must be presented at time of visit. Medicare does not cover dental. Forms of payment: Cash, credit card, checks. Best way to get seen: If not previously registered with the clinic, walk-in dental registration begins at 7:15 am and is on a first come/first serve basis. If previously registered with the clinic, call to make an appointment.     [facility] [phone] [place] RESIDENTS ONLY   Location: [address], [place], [place] Clinic Hours: Mon-Thu 10a-2p Services: Extractions only! Payment Options: FREE (donations accepted) - bring proof of income or support Best way to get seen: Call and schedule an appointment OR come at 8am on the 1st Monday of every month (except for holidays) when it is first come/first served.     [facility] [phone]   Location: [address] [place], [place] Clinic Hours: Friday mornings Services, Payment Options, Best way to get seen: Call for info

## 2021-04-11 NOTE — ED Triage Notes (Signed)
Pt has left lower toothache since last week.  Taking otc meds with minimal relief.  Pt alert  Speech clear.
